# Patient Record
Sex: Female | Born: 1935 | Race: Black or African American | Hispanic: No | Smoking: Never smoker
Health system: Southern US, Community
[De-identification: ages and names within clinical notes are randomized; demographics above are authoritative.]

## PROBLEM LIST (undated history)

## (undated) DIAGNOSIS — N390 Urinary tract infection, site not specified: Secondary | ICD-10-CM

## (undated) DIAGNOSIS — M1990 Unspecified osteoarthritis, unspecified site: Secondary | ICD-10-CM

## (undated) DIAGNOSIS — C541 Malignant neoplasm of endometrium: Secondary | ICD-10-CM

## (undated) DIAGNOSIS — K219 Gastro-esophageal reflux disease without esophagitis: Secondary | ICD-10-CM

## (undated) DIAGNOSIS — I10 Essential (primary) hypertension: Secondary | ICD-10-CM

## (undated) HISTORY — DX: Urinary tract infection, site not specified: N39.0

## (undated) HISTORY — PX: TOTAL ABDOMINAL HYSTERECTOMY W/ BILATERAL SALPINGOOPHORECTOMY: SHX83

## (undated) HISTORY — PX: EYE SURGERY: SHX253

## (undated) HISTORY — DX: Malignant neoplasm of endometrium: C54.1

## (undated) HISTORY — PX: COLONOSCOPY: SHX174

## (undated) HISTORY — PX: APPENDECTOMY: SHX54

## (undated) HISTORY — PX: JOINT REPLACEMENT: SHX530

---

## 2006-02-08 ENCOUNTER — Ambulatory Visit: Payer: Self-pay | Admitting: Nurse Practitioner

## 2006-03-06 ENCOUNTER — Ambulatory Visit: Payer: Self-pay | Admitting: General Surgery

## 2006-03-09 ENCOUNTER — Ambulatory Visit: Payer: Self-pay | Admitting: General Surgery

## 2007-02-18 ENCOUNTER — Ambulatory Visit: Payer: Self-pay | Admitting: Nurse Practitioner

## 2008-01-28 ENCOUNTER — Other Ambulatory Visit: Payer: Self-pay

## 2008-01-29 ENCOUNTER — Inpatient Hospital Stay: Payer: Self-pay | Admitting: Internal Medicine

## 2008-05-10 ENCOUNTER — Inpatient Hospital Stay: Payer: Self-pay | Admitting: *Deleted

## 2009-07-13 ENCOUNTER — Encounter: Payer: Self-pay | Admitting: Orthopedic Surgery

## 2009-07-13 ENCOUNTER — Ambulatory Visit (HOSPITAL_COMMUNITY): Admission: RE | Admit: 2009-07-13 | Discharge: 2009-07-13 | Payer: Self-pay | Admitting: Internal Medicine

## 2009-09-15 ENCOUNTER — Ambulatory Visit: Payer: Self-pay | Admitting: Orthopedic Surgery

## 2009-09-15 DIAGNOSIS — M5137 Other intervertebral disc degeneration, lumbosacral region: Secondary | ICD-10-CM | POA: Insufficient documentation

## 2009-09-15 DIAGNOSIS — M7580 Other shoulder lesions, unspecified shoulder: Secondary | ICD-10-CM

## 2009-09-15 DIAGNOSIS — M19019 Primary osteoarthritis, unspecified shoulder: Secondary | ICD-10-CM | POA: Insufficient documentation

## 2009-09-15 DIAGNOSIS — M25819 Other specified joint disorders, unspecified shoulder: Secondary | ICD-10-CM | POA: Insufficient documentation

## 2010-03-21 ENCOUNTER — Ambulatory Visit: Payer: Self-pay | Admitting: Orthopedic Surgery

## 2010-03-21 DIAGNOSIS — M542 Cervicalgia: Secondary | ICD-10-CM | POA: Insufficient documentation

## 2010-03-21 DIAGNOSIS — M75120 Complete rotator cuff tear or rupture of unspecified shoulder, not specified as traumatic: Secondary | ICD-10-CM | POA: Insufficient documentation

## 2010-04-04 ENCOUNTER — Telehealth: Payer: Self-pay | Admitting: Orthopedic Surgery

## 2010-08-09 ENCOUNTER — Ambulatory Visit (HOSPITAL_COMMUNITY)
Admission: RE | Admit: 2010-08-09 | Discharge: 2010-08-09 | Payer: Self-pay | Source: Home / Self Care | Attending: Internal Medicine | Admitting: Internal Medicine

## 2010-08-24 NOTE — Progress Notes (Signed)
Summary: wants pain med  Phone Note Call from Patient   Summary of Call: Kyeisha Janowicz ([birth year]) says she has not started therapy for her back pain because her arm hurts so bad.  Tylenol does not help.  Can you  call in something for her pain?  Uses [facility] in [place] Her # [phone] Initial call taken by: Jacklynn Ganong,  April 04, 2010 3:44 PM  Follow-up for Phone Call        ibuprofen 800 three times a day  Follow-up by: Fuller Canada MD,  April 05, 2010 7:37 AM  Additional Follow-up for Phone Call Additional follow up Details #1::        called and left pt message Additional Follow-up by: Ether Griffins,  April 05, 2010 8:46 AM

## 2010-08-24 NOTE — Assessment & Plan Note (Signed)
Summary: left shoulder pain xr ap ap/medicare/bcbs/fanta/bsf   Visit Type:  new Referring Provider:  Dr Felecia Shelling  CC:  left shoulder pain.  History of Present Illness: 77 year old female with a long history of pain in her LEFT shoulder especially over the last 3-4 months denies any injury she has painful forward elevation of the LEFT arm and shoulder and pain when she tries to internally rotate the arm are placed the hand behind back get an injection for 10 years ago which seem to help  Her pain is described as throbbing severe an 8/10 it is intermittent worse at night came on gradually is relieved with a pain killer and Motrin and worse when she raises her arm.   Xrays at [facility] on 07-13-09, show glenohumeral degenerative joint disease.  Medications: Tramadol, Amlodipine 5mg , Simvastatin 40mg , Lansoprazole 30mg .    Past History:  Past Medical History: Endometrial cancer  Past Surgical History: T A H and B S O  Review of Systems General:  Denies weight loss, weight gain, fever, chills, and fatigue. Cardiac :  Denies chest pain, angina, heart attack, heart failure, poor circulation, blood clots, and phlebitis; palpitations. Resp:  Complains of short of breath; denies difficulty breathing, COPD, cough, and pneumonia. GI:  Complains of constipation; denies nausea, vomiting, diarrhea, difficulty swallowing, ulcers, GERD, and reflux. GU:  Denies kidney failure, kidney transplant, kidney stones, burning, poor stream, testicular cancer, blood in urine, and . Neuro:  Denies headache, dizziness, migraines, numbness, weakness, tremor, and unsteady walking. MS:  Denies joint pain, rheumatoid arthritis, joint swelling, gout, bone cancer, osteoporosis, and . Endo:  Denies thyroid disease, goiter, and diabetes. Psych:  Denies depression, mood swings, anxiety, panic attack, bipolar, and schizophrenia. Derm:  Denies eczema, cancer, and itching. EENT:  Denies poor vision, cataracts, glaucoma,  poor hearing, vertigo, ears ringing, sinusitis, hoarseness, toothaches, and bleeding gums; earaches, easy bruising. Immunology:  Complains of seasonal allergies; denies sinus problems and allergic to bee stings. Lymphatic:  Denies lymph node cancer and lymph edema.  Physical Exam  Additional Exam:  GEN: well developed and nourished. normal body habitus, grooming and hygiene  CDV: normal pulses perfusion color and temperature without swelling or edema  Lymph: normal lymph system  SKIN: normal without lesions, masses, nodules  NEURO/PSYCH: Normal coordination, reflexes and sensation. Awake alert and oriented   YSA:YTKZ shoulder  Tenderness in the anterolateral deltoid.  Painful forward elevation of 120 passive.  Negative drop test the weakness in the supraspinatus.  No joint laxity., positive impingement sign  RIGHT shoulder full range of motion and normal strength, no joint laxity.      Impression & Recommendations:  Problem # 1:  OSTEOARTHRITIS, SHOULDER, LEFT (ICD-715.91) Assessment New  x-rays from the hospital show glenohumeral arthritis mild.  Orders: Physical Therapy Referral (PT)  Problem # 2:  IMPINGEMENT SYNDROME (ICD-726.2) Assessment: New  inject RIGHT shoulder Verbal consent obtained/The shoulder was injected with depomedrol 40mg /cc and sensorcaine .25% . There were no complications  Orders: New Patient Level III (60109) Joint Aspirate / Injection, Large (20610) Depo- Medrol 40mg  (J1030)  Patient Instructions: 1)  You have received an injection of cortisone today. You may experience increased pain at the injection site. Apply ice pack to the area for 20 minutes every 2 hours and take 2 xtra strength tylenol every 8 hours. This increased pain will usually resolve in 24 hours. The injection will take effect in 3-10 days.  2)  Please schedule a follow-up appointment as needed. 3)  PT  for left shoulder and lumbar

## 2010-08-24 NOTE — Assessment & Plan Note (Signed)
Summary: same problem left shoulder pain xr here feb '57mcr/bcbs/fanta   Visit Type:  Follow-up Referring Provider:  Dr Felecia Shelling  CC:  left shoulder pain.  History of Present Illness: 77 year old female with a long history of pain in her LEFT shoulder especially over the last 3-4 months denies any injury she has painful forward elevation of the LEFT arm and shoulder and pain when she tries to internally rotate the arm are placed the hand behind back get an injection for 10 years ago which seem to help  Xrays at [facility] on 07-13-09, show glenohumeral degenerative joint disease.  Medications: Tramadol, Amlodipine 5mg , Simvastatin 40mg , Lansoprazole 30mg .  Had injection in February in our office, advised needed PT for shoulder and Lumbar spine.  Did not go for therapy because the shot helped her shoulder.  Has pain from left shoulder to the left hand, with numbness in hand.  review of systems andHas alot of back pain.  Has shoulder pain that goes to the left hand.        Physical Exam  Additional Exam:  The patient is well developed and nourished, with normal grooming and hygiene. The body habitus is   The pulses and perfusion were normal with normal color, temperature  and no swelling   The shoulder is tender over the anterolateral acromion, there is no swelling, the shoulder is stable, the SubScap is 5/5, the EXT/ROT are 5/5, the SSpinatus is 4/5. The impingement sign is positive.   External rotation approximately 40, internal rotation to the back pocket.  Forward elevation approximately 120 active 150 passive.  Painful Neer maneuver  Abduction active 80 passive 110      Impression & Recommendations:  Problem # 1:  IMPINGEMENT SYNDROME (ICD-726.2) Assessment Unchanged  Verbal consent obtained/The shoulder was injected with depomedrol 40mg /cc and sensorcaine .25% . There were no complications LEFT shoulder injection subacromial space  Orders: Est. Patient  Level III (91478) Joint Aspirate / Injection, Large (20610) Depo- Medrol 40mg  (J1030)  Problem # 2:  OSTEOARTHRITIS, SHOULDER, LEFT (ICD-715.91) Assessment: Unchanged  Problem # 3:     Other Orders: Physical Therapy Referral (PT)  Patient Instructions: 1)  Back Pain  2)  tendonitis shoulder  3)  possible pinched nerve  4)  You have received an injection of cortisone today. You may experience increased pain at the injection site. Apply ice pack to the area for 20 minutes every 2 hours and take 2 xtra strength tylenol every 8 hours. This increased pain will usually resolve in 24 hours. The injection will take effect in 3-10 days.  5)  Therapy in [place] for the Back pain  6)  Please schedule a follow-up appointment as needed.

## 2010-08-24 NOTE — Letter (Signed)
Summary: Historic Patient File  Historic Patient File   Imported By: Elvera Maria 09/17/2009 10:04:52  _____________________________________________________________________  External Attachment:    Type:   Image     Comment:   history form

## 2010-09-20 ENCOUNTER — Ambulatory Visit: Payer: Self-pay | Admitting: Orthopedic Surgery

## 2010-09-21 ENCOUNTER — Encounter: Payer: Self-pay | Admitting: Orthopedic Surgery

## 2011-02-09 ENCOUNTER — Ambulatory Visit: Payer: Self-pay | Admitting: Gastroenterology

## 2013-02-20 ENCOUNTER — Emergency Department: Payer: Self-pay | Admitting: Internal Medicine

## 2013-02-20 LAB — COMPREHENSIVE METABOLIC PANEL
Albumin: 4 g/dL (ref 3.4–5.0)
Alkaline Phosphatase: 62 U/L (ref 50–136)
Anion Gap: 4 — ABNORMAL LOW (ref 7–16)
BUN: 16 mg/dL (ref 7–18)
Bilirubin,Total: 0.4 mg/dL (ref 0.2–1.0)
Calcium, Total: 8.9 mg/dL (ref 8.5–10.1)
Chloride: 106 mmol/L (ref 98–107)
Co2: 28 mmol/L (ref 21–32)
Creatinine: 0.92 mg/dL (ref 0.60–1.30)
EGFR (African American): >60
EGFR (Non-African Amer.): 56 — ABNORMAL LOW
Glucose: 99 mg/dL (ref 65–99)
Osmolality: 277 (ref 275–301)
Potassium: 3.7 mmol/L (ref 3.5–5.1)
SGOT(AST): 20 U/L (ref 15–37)
SGPT (ALT): 22 U/L (ref 12–78)
Sodium: 138 mmol/L (ref 136–145)
Total Protein: 7.1 g/dL (ref 6.4–8.2)

## 2013-02-20 LAB — CBC
HCT: 35.7 % (ref 35.0–47.0)
HGB: 12.2 g/dL (ref 12.0–16.0)
MCH: 30.9 pg (ref 26.0–34.0)
MCHC: 34.3 g/dL (ref 32.0–36.0)
MCV: 90 fL (ref 80–100)
Platelet: 241 10*3/uL (ref 150–440)
RBC: 3.96 10*6/uL (ref 3.80–5.20)
RDW: 14 % (ref 11.5–14.5)
WBC: 6.7 10*3/uL (ref 3.6–11.0)

## 2013-02-20 LAB — LIPASE, BLOOD: Lipase: 115 U/L (ref 73–393)

## 2013-05-28 ENCOUNTER — Ambulatory Visit: Payer: Self-pay | Admitting: Orthopedic Surgery

## 2013-06-04 ENCOUNTER — Ambulatory Visit: Payer: Self-pay | Admitting: Orthopedic Surgery

## 2013-06-04 LAB — URINALYSIS, COMPLETE
Bacteria: NONE SEEN
Bilirubin,UR: NEGATIVE
Blood: NEGATIVE
Glucose,UR: NEGATIVE mg/dL (ref 0–75)
Ketone: NEGATIVE
Nitrite: NEGATIVE
Ph: 7 (ref 4.5–8.0)
Protein: NEGATIVE
RBC,UR: <1 /HPF (ref 0–5)
Specific Gravity: 1.013 (ref 1.003–1.030)
Squamous Epithelial: 1
WBC UR: 7 /HPF (ref 0–5)

## 2013-06-04 LAB — BASIC METABOLIC PANEL
Anion Gap: 4 — ABNORMAL LOW (ref 7–16)
BUN: 15 mg/dL (ref 7–18)
Calcium, Total: 8.7 mg/dL (ref 8.5–10.1)
Chloride: 107 mmol/L (ref 98–107)
Co2: 28 mmol/L (ref 21–32)
Creatinine: 0.76 mg/dL (ref 0.60–1.30)
EGFR (African American): >60
EGFR (Non-African Amer.): >60
Glucose: 137 mg/dL — ABNORMAL HIGH (ref 65–99)
Osmolality: 281 (ref 275–301)
Potassium: 3.9 mmol/L (ref 3.5–5.1)
Sodium: 139 mmol/L (ref 136–145)

## 2013-06-04 LAB — CBC
HCT: 35.9 % (ref 35.0–47.0)
HGB: 12.1 g/dL (ref 12.0–16.0)
MCH: 30.2 pg (ref 26.0–34.0)
MCHC: 33.6 g/dL (ref 32.0–36.0)
MCV: 90 fL (ref 80–100)
Platelet: 308 10*3/uL (ref 150–440)
RBC: 4 10*6/uL (ref 3.80–5.20)
RDW: 13.8 % (ref 11.5–14.5)
WBC: 6.1 10*3/uL (ref 3.6–11.0)

## 2013-06-04 LAB — PROTIME-INR
INR: 1
Prothrombin Time: 13.1 secs (ref 11.5–14.7)

## 2013-06-04 LAB — APTT: Activated PTT: 32.8 secs (ref 23.6–35.9)

## 2013-06-04 LAB — MRSA PCR SCREENING

## 2013-06-04 LAB — SEDIMENTATION RATE: Erythrocyte Sed Rate: 16 mm/hr (ref 0–30)

## 2013-06-17 ENCOUNTER — Inpatient Hospital Stay: Payer: Self-pay | Admitting: Orthopedic Surgery

## 2013-06-18 LAB — PLATELET COUNT: Platelet: 181 10*3/uL (ref 150–440)

## 2013-06-18 LAB — BASIC METABOLIC PANEL
Anion Gap: 6 — ABNORMAL LOW (ref 7–16)
BUN: 11 mg/dL (ref 7–18)
Calcium, Total: 8.1 mg/dL — ABNORMAL LOW (ref 8.5–10.1)
Chloride: 108 mmol/L — ABNORMAL HIGH (ref 98–107)
Co2: 24 mmol/L (ref 21–32)
Creatinine: 0.7 mg/dL (ref 0.60–1.30)
EGFR (African American): >60
EGFR (Non-African Amer.): >60
Glucose: 121 mg/dL — ABNORMAL HIGH (ref 65–99)
Osmolality: 276 (ref 275–301)
Potassium: 3.6 mmol/L (ref 3.5–5.1)
Sodium: 138 mmol/L (ref 136–145)

## 2013-06-18 LAB — HEMOGLOBIN: HGB: 10.1 g/dL — ABNORMAL LOW (ref 12.0–16.0)

## 2013-06-23 LAB — PATHOLOGY REPORT

## 2013-11-07 ENCOUNTER — Emergency Department: Payer: Self-pay | Admitting: Emergency Medicine

## 2013-11-07 LAB — COMPREHENSIVE METABOLIC PANEL
Albumin: 4.1 g/dL (ref 3.4–5.0)
Alkaline Phosphatase: 77 U/L
Anion Gap: 6 — ABNORMAL LOW (ref 7–16)
BUN: 21 mg/dL — ABNORMAL HIGH (ref 7–18)
Bilirubin,Total: 0.3 mg/dL (ref 0.2–1.0)
Calcium, Total: 9.3 mg/dL (ref 8.5–10.1)
Chloride: 108 mmol/L — ABNORMAL HIGH (ref 98–107)
Co2: 26 mmol/L (ref 21–32)
Creatinine: 0.65 mg/dL (ref 0.60–1.30)
EGFR (African American): >60
EGFR (Non-African Amer.): >60
Glucose: 101 mg/dL — ABNORMAL HIGH (ref 65–99)
Osmolality: 283 (ref 275–301)
Potassium: 4.3 mmol/L (ref 3.5–5.1)
SGOT(AST): 21 U/L (ref 15–37)
SGPT (ALT): 14 U/L (ref 12–78)
Sodium: 140 mmol/L (ref 136–145)
Total Protein: 7.5 g/dL (ref 6.4–8.2)

## 2013-11-07 LAB — URINALYSIS, COMPLETE
Bilirubin,UR: NEGATIVE
Glucose,UR: NEGATIVE mg/dL (ref 0–75)
Ketone: NEGATIVE
Nitrite: NEGATIVE
Ph: 6 (ref 4.5–8.0)
Protein: NEGATIVE
RBC,UR: 1 /HPF (ref 0–5)
Specific Gravity: 1.012 (ref 1.003–1.030)
Squamous Epithelial: 2
WBC UR: 3 /HPF (ref 0–5)

## 2013-11-07 LAB — CBC WITH DIFFERENTIAL/PLATELET
Basophil #: 0 10*3/uL (ref 0.0–0.1)
Basophil %: 0.8 %
Eosinophil #: 0.1 10*3/uL (ref 0.0–0.7)
Eosinophil %: 2.5 %
HCT: 37.8 % (ref 35.0–47.0)
HGB: 12.2 g/dL (ref 12.0–16.0)
Lymphocyte #: 1.1 10*3/uL (ref 1.0–3.6)
Lymphocyte %: 19.8 %
MCH: 28.5 pg (ref 26.0–34.0)
MCHC: 32.2 g/dL (ref 32.0–36.0)
MCV: 89 fL (ref 80–100)
Monocyte #: 0.3 x10 3/mm (ref 0.2–0.9)
Monocyte %: 5.8 %
Neutrophil #: 4 10*3/uL (ref 1.4–6.5)
Neutrophil %: 71.1 %
Platelet: 303 10*3/uL (ref 150–440)
RBC: 4.27 10*6/uL (ref 3.80–5.20)
RDW: 15.3 % — ABNORMAL HIGH (ref 11.5–14.5)
WBC: 5.7 10*3/uL (ref 3.6–11.0)

## 2013-11-07 LAB — TROPONIN I: Troponin-I: <0.02 ng/mL

## 2014-06-17 ENCOUNTER — Emergency Department: Payer: Self-pay | Admitting: Student

## 2014-06-17 LAB — APTT: Activated PTT: 33.2 secs (ref 23.6–35.9)

## 2014-06-17 LAB — URINALYSIS, COMPLETE
Bacteria: NONE SEEN
Bilirubin,UR: NEGATIVE
Blood: NEGATIVE
Glucose,UR: NEGATIVE mg/dL (ref 0–75)
Ketone: NEGATIVE
Leukocyte Esterase: NEGATIVE
Nitrite: NEGATIVE
Ph: 6 (ref 4.5–8.0)
Protein: NEGATIVE
RBC,UR: 2 /HPF (ref 0–5)
Specific Gravity: 1.014 (ref 1.003–1.030)
Squamous Epithelial: 1
WBC UR: NONE SEEN /HPF (ref 0–5)

## 2014-06-17 LAB — COMPREHENSIVE METABOLIC PANEL
Albumin: 3.9 g/dL (ref 3.4–5.0)
Alkaline Phosphatase: 72 U/L
Anion Gap: 5 — ABNORMAL LOW (ref 7–16)
BUN: 14 mg/dL (ref 7–18)
Bilirubin,Total: 0.5 mg/dL (ref 0.2–1.0)
Calcium, Total: 8.6 mg/dL (ref 8.5–10.1)
Chloride: 111 mmol/L — ABNORMAL HIGH (ref 98–107)
Co2: 26 mmol/L (ref 21–32)
Creatinine: 0.74 mg/dL (ref 0.60–1.30)
EGFR (African American): >60
EGFR (Non-African Amer.): >60
Glucose: 90 mg/dL (ref 65–99)
Osmolality: 283 (ref 275–301)
Potassium: 3.8 mmol/L (ref 3.5–5.1)
SGOT(AST): 27 U/L (ref 15–37)
SGPT (ALT): 28 U/L
Sodium: 142 mmol/L (ref 136–145)
Total Protein: 7.1 g/dL (ref 6.4–8.2)

## 2014-06-17 LAB — CBC
HCT: 38.4 % (ref 35.0–47.0)
HGB: 12.2 g/dL (ref 12.0–16.0)
MCH: 29.6 pg (ref 26.0–34.0)
MCHC: 31.8 g/dL — ABNORMAL LOW (ref 32.0–36.0)
MCV: 93 fL (ref 80–100)
Platelet: 216 10*3/uL (ref 150–440)
RBC: 4.13 10*6/uL (ref 3.80–5.20)
RDW: 14.6 % — ABNORMAL HIGH (ref 11.5–14.5)
WBC: 6.3 10*3/uL (ref 3.6–11.0)

## 2014-06-17 LAB — PROTIME-INR
INR: 1
Prothrombin Time: 13.1 secs (ref 11.5–14.7)

## 2014-06-17 LAB — TROPONIN I: Troponin-I: <0.02 ng/mL

## 2014-07-09 ENCOUNTER — Ambulatory Visit: Payer: Self-pay | Admitting: Gastroenterology

## 2014-11-13 NOTE — Op Note (Signed)
PATIENT NAME:  Toni Paul, Toni Paul MR#:  [id number] DATE OF BIRTH:  [birth year]  DATE OF PROCEDURE:  06/17/2013  PREOPERATIVE DIAGNOSIS: Left knee osteoarthritis.   POSTOPERATIVE DIAGNOSIS: Left knee osteoarthritis.   PROCEDURE: Left total knee replacement.   SURGEON: Hessie Knows, M.D.   ASSISTANT:  Rachelle Hora, PA-C.   ANESTHESIA: Spinal.   DESCRIPTION OF PROCEDURE: The patient was brought to the operating room and after adequate spinal anesthesia was obtained, the left leg was prepped and draped in the usual sterile fashion with a tourniquet applied to the upper thigh. After patient identification and timeout procedures were completed, the leg was exsanguinated with an Esmarch, and the tourniquet raised to 300 mmHg. Prior midline incision was opened from a prior ORIF of the patella and extended proximally and distally. Medial parapatellar arthrotomy then performed. Inspection revealed exposed bone with eburnation of both medial and lateral femoral condyles and exposed bone on the tibia condyles, extensive erosive changes to the patella with exposed bone on femoral trochlea and patella, consistent with severe osteoarthritis. The ACL and fat pad were excised. The proximal tibia was exposed to allow for application of the Medacta cutting block. Tibia cutting block was placed, alignment checked and proximal tibia cut carried out, and the bone removed, along with the anterior horns of the menisci. The femoral cutting block was then applied after removing a small amount of residual cartilage and the distal femoral cut carried out. Size 4 cutting block applied; anterior, posterior and chamfer cuts made. The residual meniscus was excised at this point, along with posterior spur off the femur. The 3 tibia was placed, and the bone was quite soft laterally, and so preparation for stemmed implant was made, and the 3 tibia baseplate trial with the keel punch placed, the femur with a 10 mm insert; full extension  could be obtained. The trochlear cut was then created on the distal femur. All trials were removed, and the patella was cut with patellar cutting guide. Drill holes made and patella sized to a size 1. The knee was thoroughly irrigated and the tourniquet let down. Hemostasis was checked with electrocautery. Exparel diluted with saline was infiltrated in the periarticular tissue, as was a combination of Sensorcaine with epinephrine and morphine. The tourniquet was then raised, and the bony surfaces thoroughly irrigated and dried. Tibial component was cemented into place first with excess cement removed, followed by tibial trial and the femoral component, both components cemented. The knee was held in extension as the patellar button was clamped into place. After the cement had set, the final tibial insert was placed with a 10 mm insert chosen, and the set screw placed within it. The knee was thoroughly irrigated after excess cement had been removed. The patella tracked well with no touch technique. The arthrotomy was repaired using a heavy quill suture followed by 2-0 quill subcutaneously and skin staples. Xeroform, 4 x 4's, ABD, Webril, Polar Care and Ace wrap were applied. The patient was sent to the recovery room in stable condition.   ESTIMATED BLOOD LOSS: 100.   COMPLICATIONS: None.   SPECIMEN: Cut ends of bone.   TOURNIQUET TIME: 72 minutes at 300 mmHg.   IMPLANTS: Medacta GMK sphere system with a size 4 left femoral component, size T3-T4 left fixed tibial tray with an 11 x 65 mm stem and a 10 mm left 4 flex insert and a 1 patella.   CONDITION: To recovery room stable.    No complications.   ____________________________ Christian Mate.  Rudene Christians, MD mjm:dmm D: 06/17/2013 10:30:07 ET T: 06/17/2013 10:46:38 ET JOB#: [id number]  cc: Laurene Footman, MD, <Dictator> Laurene Footman MD ELECTRONICALLY SIGNED 06/17/2013 12:38

## 2014-11-13 NOTE — Discharge Summary (Signed)
PATIENT NAME:  Toni Paul, Toni Paul MR#:  [id number] DATE OF BIRTH:  Jan 31, 1923  DATE OF ADMISSION:  06/17/2013 DATE OF DISCHARGE:  06/20/2013  ADMITTING DIAGNOSIS: Left knee osteoarthritis.   DISCHARGE DIAGNOSIS: Left knee osteoarthritis.   PROCEDURE: Left total knee replacement.   SURGEON: Laurene Footman, M.D.   ASSISTANT: Rachelle Hora, PA-C.   ANESTHESIA: Spinal.   ESTIMATED BLOOD LOSS: 100 mL.   COMPLICATIONS: None.   SPECIMEN: Cut ends of bone.   TOURNIQUET TIME: 72 minutes at 300 mmHg.  IMPLANTS: Medacta GMK sphere system with a size 4 left femoral component, size T3-T4 left fixed tibial tray with a 11 x 65 mm stem and a 10 mm left 4 Flex insert and 1 patella.   CONDITION: To recovery room stable.   HISTORY: The patient is an 81 year old female with severe left knee pain. She had been walking long distances until her knee got much worse. She has had a history of ORIF of the patella based on CT results and superior broken wire may need to be removed. The patient is unable to perform activities of daily living. She agreed and consented to a left total knee replacement.   PHYSICAL EXAMINATION:  LUNGS: Clear to auscultation.  HEART: Regular rate and rhythm.  HEENT: Remarkable for upper and lower dentures.  EXTREMITIES: Left knee. She has a varus deformity that is passively correctable, approximately 10 to 120 degrees range of motion with medial and patellofemoral crepitation. Distally, neurovascular intact with palpable pulses.   HOSPITAL COURSE: The patient was admitted to the hospital on June 17, 2013. She had surgery that same day and was brought to the orthopedic floor from the PACU in stable condition. On postoperative day 1, the patient's labs and vital signs are stable. She progressed very well with physical therapy and ambulated 175 feet. On postoperative day 2, the patient continued to progress well with therapy. She does not have any nausea or vomiting. She was  tolerating p.o. well. She was doing very well. On postoperative day 3, the patient continued to progress with physical therapy. Pain was controlled. She had a bowel movement and was stable and ready for discharge to home with home health patient.   CONDITION AT DISCHARGE: Stable.   DISCHARGE INSTRUCTIONS: The patient may gradually increase weight-bearing on the affected extremity. She is to elevate the affected foot or leg on 1 or 2 pillows with the foot higher than the knee. Thigh-high TED hose on both legs and remove at bedtime and replace when arising the next morning. Elevate heels off the bed. Use incentive spirometry every hour while awake and encourage cough and deep breathing. You may resume a regular diet as tolerated. Continue using Polar Care unit maintaining temperature between 40 and 50 degrees. Do not get the dressing or bandage wet or dirty. Call [facility] orthopedics if the dressing gets water under it. Leave the dressing on. Call [facility] orthopedics if any of the following occur: Bright red bleeding from the incision wound, fever above 101.5 degrees, redness, swelling, or drainage at the incision. Call [facility] orthopedics if you experience any increased leg pain, numbness or weakness in legs or bowel or bladder symptoms. Home physical therapy has been arranged for continuation of rehab program. Please call [facility] orthopedics if a therapist has not contacted you within 48 hours of your return home. Call [facility] orthopedics for followup appointment and need to be seen at [facility] in 2 weeks. Please call for  this appointment with Dr. Rudene Christians.   FOLLOWUP PHYSICIAN: Dr. Rudene Christians.   DISCHARGE MEDICATIONS: Dexilant 60 mg 1 cap orally once a day, VESIcare 5 mg 1 tablet orally once a day, losartan 100 mg oral tablet 1 tablet orally once a day in the morning, simvastatin 20 mg oral tablet 1 tablet orally once a day at bedtime, calcium 600 D 1 tablet  orally 2 times a day, vitamin D3 2000 international units 1 tab orally once a day, aspirin 81 mg 1 tablet orally once a day at bedtime, Tylenol 500 mg oral tablet 1 tablet orally every 4 hours as needed for pain or temp greater than 100.4, oxycodone 5 mg 1 tablet orally every 4 hours needed for pain, magnesium hydroxide 8% oral suspension 38 mL orally 2 times a day as needed for constipation, Xarelto 10 mg oral tablet 1 tablet orally once a day in the morning x 9 days, bisacodyl 10 mg rectal suppository 1 suppository rectally once as needed for constipation, docusate/senna 50 mg/8.6 mg oral tablet 1 tablet orally 2 times a day. ____________________________ T. Rachelle Hora, PA-C tcg:aw D: 06/19/2013 09:14:01 ET T: 06/19/2013 09:33:30 ET JOB#: [id number]  cc: T. Rachelle Hora, PA-C, <Dictator> Duanne Guess [place] ELECTRONICALLY SIGNED 06/27/2013 17:21

## 2014-11-16 LAB — SURGICAL PATHOLOGY

## 2014-12-10 ENCOUNTER — Other Ambulatory Visit: Payer: Self-pay | Admitting: Nurse Practitioner

## 2014-12-10 DIAGNOSIS — G8929 Other chronic pain: Secondary | ICD-10-CM

## 2014-12-10 DIAGNOSIS — R51 Headache: Principal | ICD-10-CM

## 2014-12-10 DIAGNOSIS — R519 Headache, unspecified: Secondary | ICD-10-CM

## 2014-12-17 ENCOUNTER — Ambulatory Visit
Admission: RE | Admit: 2014-12-17 | Discharge: 2014-12-17 | Disposition: A | Discharge status: Home / Self Care | Payer: Medicare Other | Source: Ambulatory Visit | Attending: Nurse Practitioner | Admitting: Nurse Practitioner

## 2014-12-17 DIAGNOSIS — R51 Headache: Secondary | ICD-10-CM | POA: Insufficient documentation

## 2014-12-17 DIAGNOSIS — G319 Degenerative disease of nervous system, unspecified: Secondary | ICD-10-CM | POA: Diagnosis not present

## 2014-12-17 DIAGNOSIS — G8929 Other chronic pain: Secondary | ICD-10-CM

## 2014-12-17 DIAGNOSIS — R519 Headache, unspecified: Secondary | ICD-10-CM

## 2015-06-14 DIAGNOSIS — G500 Trigeminal neuralgia: Secondary | ICD-10-CM | POA: Insufficient documentation

## 2015-10-19 DIAGNOSIS — M5416 Radiculopathy, lumbar region: Secondary | ICD-10-CM | POA: Insufficient documentation

## 2016-02-28 ENCOUNTER — Emergency Department: Payer: Medicare Other

## 2016-02-28 ENCOUNTER — Emergency Department
Admission: EM | Admit: 2016-02-28 | Discharge: 2016-02-28 | Disposition: A | Discharge status: Home / Self Care | Payer: Medicare Other | Attending: Emergency Medicine | Admitting: Emergency Medicine

## 2016-02-28 ENCOUNTER — Encounter: Payer: Self-pay | Admitting: Emergency Medicine

## 2016-02-28 DIAGNOSIS — G43909 Migraine, unspecified, not intractable, without status migrainosus: Secondary | ICD-10-CM | POA: Diagnosis present

## 2016-02-28 DIAGNOSIS — R51 Headache: Secondary | ICD-10-CM | POA: Insufficient documentation

## 2016-02-28 DIAGNOSIS — Z79899 Other long term (current) drug therapy: Secondary | ICD-10-CM | POA: Diagnosis not present

## 2016-02-28 DIAGNOSIS — Z7982 Long term (current) use of aspirin: Secondary | ICD-10-CM | POA: Diagnosis not present

## 2016-02-28 DIAGNOSIS — F17200 Nicotine dependence, unspecified, uncomplicated: Secondary | ICD-10-CM | POA: Diagnosis not present

## 2016-02-28 DIAGNOSIS — R0789 Other chest pain: Secondary | ICD-10-CM | POA: Insufficient documentation

## 2016-02-28 DIAGNOSIS — R519 Headache, unspecified: Secondary | ICD-10-CM

## 2016-02-28 LAB — CBC WITH DIFFERENTIAL/PLATELET
Basophils Absolute: 0 10*3/uL (ref 0–0.1)
Basophils Relative: 1 %
Eosinophils Absolute: 0.1 10*3/uL (ref 0–0.7)
Eosinophils Relative: 2 %
HCT: 37 % (ref 35.0–47.0)
Hemoglobin: 12.6 g/dL (ref 12.0–16.0)
Lymphocytes Relative: 19 %
Lymphs Abs: 1 10*3/uL (ref 1.0–3.6)
MCH: 30.6 pg (ref 26.0–34.0)
MCHC: 33.9 g/dL (ref 32.0–36.0)
MCV: 90.3 fL (ref 80.0–100.0)
Monocytes Absolute: 0.3 10*3/uL (ref 0.2–0.9)
Monocytes Relative: 5 %
Neutro Abs: 3.9 10*3/uL (ref 1.4–6.5)
Neutrophils Relative %: 73 %
Platelets: 213 10*3/uL (ref 150–440)
RBC: 4.1 MIL/uL (ref 3.80–5.20)
RDW: 14.4 % (ref 11.5–14.5)
WBC: 5.4 10*3/uL (ref 3.6–11.0)

## 2016-02-28 LAB — COMPREHENSIVE METABOLIC PANEL
ALT: 14 U/L (ref 14–54)
AST: 20 U/L (ref 15–41)
Albumin: 4.2 g/dL (ref 3.5–5.0)
Alkaline Phosphatase: 62 U/L (ref 38–126)
Anion gap: 8 (ref 5–15)
BUN: 14 mg/dL (ref 6–20)
CO2: 25 mmol/L (ref 22–32)
Calcium: 9.4 mg/dL (ref 8.9–10.3)
Chloride: 108 mmol/L (ref 101–111)
Creatinine, Ser: 0.71 mg/dL (ref 0.44–1.00)
GFR calc Af Amer: >60 mL/min (ref >60)
GFR calc non Af Amer: >60 mL/min (ref >60)
Glucose, Bld: 107 mg/dL — ABNORMAL HIGH (ref 65–99)
Potassium: 3.9 mmol/L (ref 3.5–5.1)
Sodium: 141 mmol/L (ref 135–145)
Total Bilirubin: 0.7 mg/dL (ref 0.3–1.2)
Total Protein: 6.9 g/dL (ref 6.5–8.1)

## 2016-02-28 LAB — TROPONIN I: Troponin I: <0.03 ng/mL (ref <0.03)

## 2016-02-28 LAB — SEDIMENTATION RATE: Sed Rate: 21 mm/hr (ref 0–30)

## 2016-02-28 MED ORDER — ACETAMINOPHEN 325 MG PO TABS
650.0000 mg | ORAL_TABLET | Freq: Once | ORAL | Status: AC
  Administered 2016-02-28: 650 mg via ORAL

## 2016-02-28 MED ORDER — ACETAMINOPHEN 325 MG PO TABS
ORAL_TABLET | ORAL | Status: AC
  Administered 2016-02-28: 650 mg via ORAL
  Filled 2016-02-28: qty 2

## 2016-02-28 MED ORDER — TRAMADOL HCL 50 MG PO TABS: 50.0000 mg | ORAL_TABLET | Freq: Four times a day (QID) | ORAL | 0 refills | Status: DC | PRN

## 2016-02-28 NOTE — ED Notes (Signed)
AAOx3.  Skin warm and dry.  Ambulates with easy and steady gait. NAD 

## 2016-02-28 NOTE — ED Notes (Signed)
AAOx3.  Skin warm and dry.  MAE equally and strong.  NAD

## 2016-02-28 NOTE — ED Provider Notes (Signed)
[facility] Emergency Department Provider Note   ____________________________________________    I have reviewed the triage vital signs and the nursing notes.   HISTORY  Chief Complaint Headache    HPI Toni Paul is a 82 y.o. female who presents with complaints of headache. Patient has a long history of headaches and has seen neurology in the past. The working diagnosis at that time was either tension headaches or trigeminal neuralgia. Patient reports she has been having similar headaches to those in the past including right-sided head pain that is throbbing in nature. She reports pain improves with Tylenol. She denies neuro deficits. No fevers or chills. No neck pain. Patient also reports she has chest discomfort last night at approximately 12 AM which has resolved. She denies short of breath. No cough or pleurisy   Past Medical History:  Diagnosis Date  . Endometrial cancer [facility])     Patient Active Problem List   Diagnosis Date Noted  . CERVICALGIA 03/21/2010  . RUPTURE ROTATOR CUFF 03/21/2010  . Osteoarthrosis, unspecified whether generalized or localized, shoulder region 09/15/2009  . 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 IMPINGEMENT SYNDROME 09/15/2009    Past Surgical History:  Procedure Laterality Date  . TOTAL ABDOMINAL HYSTERECTOMY W/ BILATERAL SALPINGOOPHORECTOMY      Prior to Admission medications   Not on File     Allergies Review of patient's allergies indicates no known allergies.  No family history on file.  Social History Social History  Substance Use Topics  . Smoking status: Current Every Day Smoker  . Smokeless tobacco: Never Used  . Alcohol use Not on file    Review of Systems  Constitutional: No fever/chills Eyes: No visual changes.  ENT: No Neck pain Cardiovascular: No chest pain currently Respiratory: Denies shortness of breath. Gastrointestinal: No nausea, no vomiting.     Genitourinary: Negative for dysuria. Musculoskeletal: Negative for back pain. No neck pain Skin: Negative for rash. Neurological: Negative for focal weakness  10-point ROS otherwise negative.  ____________________________________________   PHYSICAL EXAM:  VITAL SIGNS: ED Triage Vitals [02/28/16 1011]  Enc Vitals Group     BP (!) 140/57     Pulse Rate 61     Resp 16     Temp 97.8 F (36.6 C)     Temp Source Oral     SpO2 99 %     Weight 140 lb (63.5 kg)     Height 5\' 2"  (1.575 m)     Head Circumference      Peak Flow      Pain Score 8     Pain Loc      Pain Edu?      Excl. in [place]?     Constitutional: Alert and oriented. No acute distress. Eyes: Conjunctivae are normal. EOMI, PERRLA Head: Atraumatic. No tenderness over the temples, no jaw claudication Nose: No congestion/rhinnorhea. Mouth/Throat: Mucous membranes are moist.   Neck:  Painless ROM, no meningismus Cardiovascular: Normal rate, regular rhythm. Grossly normal heart sounds.  Good peripheral circulation. Respiratory: Normal respiratory effort.  No retractions. Lungs CTAB. Gastrointestinal: Soft and nontender. No distention.  No CVA tenderness. Genitourinary: deferred Musculoskeletal: No lower extremity tenderness nor edema.  Warm and well perfused Neurologic:  Normal speech and language. No gross focal neurologic deficits are appreciated.  Skin:  Skin is warm, dry and intact. No rash noted. Psychiatric: Mood and affect are normal. Speech and behavior are normal.  ____________________________________________   LABS (all  labs ordered are listed, but only abnormal results are displayed)  Labs Reviewed  CBC WITH DIFFERENTIAL/PLATELET  COMPREHENSIVE METABOLIC PANEL  TROPONIN I  SEDIMENTATION RATE   ____________________________________________  EKG  ED ECG REPORT I, Lavonia Drafts, the attending physician, personally viewed and interpreted this ECG.  Date: 02/28/2016 EKG Time: 10:01 AM Rate:  65 Rhythm: normal sinus rhythm QRS Axis: normal Intervals: normal ST/T Wave abnormalities: normal Conduction Disturbances: none Narrative Interpretation: unremarkable  ____________________________________________  RADIOLOGY  CT head unremarkable ____________________________________________   PROCEDURES  Procedure(s) performed: No    Critical Care performed: No ____________________________________________   INITIAL IMPRESSION / ASSESSMENT AND PLAN / ED COURSE  Pertinent labs & imaging results that were available during my care of the patient were reviewed by me and considered in my medical decision making (see chart for details).  Patient presents with what sounds like her usual headaches, she has no neuro deficits. No fevers or chills. No meningismus. No worsening symptoms. However she is not had imaging in some time, we'll obtain CT head. In addition she complained of chest pain that occurred overnight for this long since resolved. We'll check cardiac enzymes, EKG, chest x-ray and reevaluate.  Clinical Course  Lab work is unremarkable. EKG is benign. Patient's headache improved with Tylenol. CT scan is normal. Speech consistent with her typical headache. No neuro deficits. Given that she is feeling better and has had these headaches in the past and will have her follow-up with her neurologist Dr. Melrose Nakayama. Return precautions discussed ____________________________________________   FINAL CLINICAL IMPRESSION(S) / ED DIAGNOSES  Final diagnoses:  Atypical chest pain  Acute nonintractable headache, unspecified headache type      NEW MEDICATIONS STARTED DURING THIS VISIT:  New Prescriptions   No medications on file     Note:  This document was prepared using Dragon voice recognition software and may include unintentional dictation errors.    Lavonia Drafts, MD 02/28/16 1259

## 2016-02-28 NOTE — ED Notes (Signed)
Reports headaches x 6 months worse today with vomiting. Also some right sided chest pain this am.  Skin w/d.  NSR on the monitor at this time.

## 2016-02-28 NOTE — ED Triage Notes (Signed)
Pt states she woke up with a headache this am, has been vomiting, and developed midsternal cp, had a few episodes of cp yesterday as well. Daughter states pt has been c/o earache and headache since mid July, denies migraines. No facial droop noted, speech clear, grips equal bilaterally.

## 2016-04-04 ENCOUNTER — Emergency Department: Payer: Medicare Other

## 2016-04-04 ENCOUNTER — Emergency Department
Admission: EM | Admit: 2016-04-04 | Discharge: 2016-04-04 | Disposition: A | Discharge status: Home / Self Care | Payer: Medicare Other | Attending: Emergency Medicine | Admitting: Emergency Medicine

## 2016-04-04 ENCOUNTER — Encounter: Payer: Self-pay | Admitting: Emergency Medicine

## 2016-04-04 DIAGNOSIS — R0789 Other chest pain: Secondary | ICD-10-CM

## 2016-04-04 DIAGNOSIS — Z79899 Other long term (current) drug therapy: Secondary | ICD-10-CM | POA: Diagnosis not present

## 2016-04-04 DIAGNOSIS — K219 Gastro-esophageal reflux disease without esophagitis: Secondary | ICD-10-CM | POA: Diagnosis not present

## 2016-04-04 DIAGNOSIS — Z7982 Long term (current) use of aspirin: Secondary | ICD-10-CM | POA: Insufficient documentation

## 2016-04-04 DIAGNOSIS — Z8542 Personal history of malignant neoplasm of other parts of uterus: Secondary | ICD-10-CM | POA: Insufficient documentation

## 2016-04-04 LAB — BASIC METABOLIC PANEL
Anion gap: 5 (ref 5–15)
BUN: 13 mg/dL (ref 6–20)
CO2: 27 mmol/L (ref 22–32)
Calcium: 9.5 mg/dL (ref 8.9–10.3)
Chloride: 111 mmol/L (ref 101–111)
Creatinine, Ser: 0.61 mg/dL (ref 0.44–1.00)
GFR calc Af Amer: >60 mL/min (ref >60)
GFR calc non Af Amer: >60 mL/min (ref >60)
Glucose, Bld: 112 mg/dL — ABNORMAL HIGH (ref 65–99)
Potassium: 3.9 mmol/L (ref 3.5–5.1)
Sodium: 143 mmol/L (ref 135–145)

## 2016-04-04 LAB — TROPONIN I
Troponin I: <0.03 ng/mL (ref <0.03)
Troponin I: <0.03 ng/mL (ref <0.03)

## 2016-04-04 LAB — CBC
HCT: 36.6 % (ref 35.0–47.0)
Hemoglobin: 12.6 g/dL (ref 12.0–16.0)
MCH: 31 pg (ref 26.0–34.0)
MCHC: 34.4 g/dL (ref 32.0–36.0)
MCV: 89.8 fL (ref 80.0–100.0)
Platelets: 276 10*3/uL (ref 150–440)
RBC: 4.07 MIL/uL (ref 3.80–5.20)
RDW: 14.4 % (ref 11.5–14.5)
WBC: 4.8 10*3/uL (ref 3.6–11.0)

## 2016-04-04 LAB — LIPASE, BLOOD: Lipase: 26 U/L (ref 11–51)

## 2016-04-04 LAB — HEPATIC FUNCTION PANEL
ALT: 10 U/L — ABNORMAL LOW (ref 14–54)
AST: 19 U/L (ref 15–41)
Albumin: 4.4 g/dL (ref 3.5–5.0)
Alkaline Phosphatase: 66 U/L (ref 38–126)
Bilirubin, Direct: <0.1 mg/dL — ABNORMAL LOW (ref 0.1–0.5)
Total Bilirubin: 0.3 mg/dL (ref 0.3–1.2)
Total Protein: 7 g/dL (ref 6.5–8.1)

## 2016-04-04 MED ORDER — FAMOTIDINE 20 MG PO TABS
40.0000 mg | ORAL_TABLET | Freq: Once | ORAL | Status: AC
  Administered 2016-04-04: 40 mg via ORAL
  Filled 2016-04-04: qty 2

## 2016-04-04 MED ORDER — ALUM & MAG HYDROXIDE-SIMETH 200-200-20 MG/5ML PO SUSP
30.0000 mL | Freq: Once | ORAL | Status: AC
  Administered 2016-04-04: 30 mL via ORAL
  Filled 2016-04-04: qty 30

## 2016-04-04 MED ORDER — SUCRALFATE 1 G PO TABS: 1.0000 g | ORAL_TABLET | Freq: Four times a day (QID) | ORAL | 1 refills | Status: DC

## 2016-04-04 MED ORDER — FAMOTIDINE 20 MG PO TABS: 20.0000 mg | ORAL_TABLET | Freq: Two times a day (BID) | ORAL | 0 refills | Status: DC

## 2016-04-04 NOTE — ED Triage Notes (Signed)
Pt to ed with c/o chest pain and upper epigastric area that started yesterday and last night,  Pt states worse during the night.  +nausea, and vomiting.  Pt states "it feels like my stomach is burning on the inside.  Pt alert and oriented.

## 2016-04-04 NOTE — ED Notes (Signed)
ekg read by dr Joni Fears

## 2016-04-04 NOTE — ED Notes (Signed)
Patient transported to X-ray 

## 2016-04-04 NOTE — ED Provider Notes (Signed)
[facility] Emergency Department Provider Note  ____________________________________________  Time seen: Approximately 12:54 PM  I have reviewed the triage vital signs and the nursing notes.   HISTORY  Chief Complaint Chest Pain    HPI Toni Paul is a 82 y.o. female who complains of chest pain that started yesterday, gradually. When asked to indicate the area of pain she actually indicates her epigastrium. Not exertional, not pleuritic. Worse with eating. Worse lying down and with movement. No fevers chills shortness of breath or vomiting. She does have some nausea. No sore throat or coughing. No recent illnesses. She does have a history of GERD which she manages with Tums and a PPI.Pain is nonradiating. Intermittent and "sore", moderate intensity.     Past Medical History:  Diagnosis Date  . Endometrial cancer [facility])      Patient Active Problem List   Diagnosis Date Noted  . CERVICALGIA 03/21/2010  . RUPTURE ROTATOR CUFF 03/21/2010  . Osteoarthrosis, unspecified whether generalized or localized, shoulder region 09/15/2009  . 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 IMPINGEMENT SYNDROME 09/15/2009     Past Surgical History:  Procedure Laterality Date  . TOTAL ABDOMINAL HYSTERECTOMY W/ BILATERAL SALPINGOOPHORECTOMY       Prior to Admission medications   Medication Sig Start Date End Date Taking? Authorizing Provider  acetaminophen (TYLENOL) 500 MG chewable tablet Chew 500 mg by mouth every 6 (six) hours as needed for pain.   Yes Historical Provider, MD  aspirin 81 MG EC tablet Take 81 mg by mouth daily.    Yes Historical Provider, MD  calcium carbonate (OS-CAL - DOSED IN MG OF ELEMENTAL CALCIUM) 1250 (500 Ca) MG tablet Take 1 tablet by mouth 2 (two) times daily with a meal.   Yes Historical Provider, MD  clobetasol ointment (TEMOVATE) AB-123456789 % Apply 1 application topically at bedtime.   Yes Historical Provider, MD   dexlansoprazole (DEXILANT) 60 MG capsule Take 60 mg by mouth daily.   Yes Historical Provider, MD  losartan (COZAAR) 100 MG tablet Take 100 mg by mouth daily.   Yes Historical Provider, MD  neomycin-polymyxin-hydrocortisone (CORTISPORIN) 3.5-10000-1 otic suspension Place 3 drops into the left ear 4 (four) times daily.   Yes Historical Provider, MD  Travoprost, BAK Free, (TRAVATAN) 0.004 % SOLN ophthalmic solution Place 1 drop into both eyes at bedtime.   Yes Historical Provider, MD  famotidine (PEPCID) 20 MG tablet Take 1 tablet (20 mg total) by mouth 2 (two) times daily. 04/04/16   Carrie Mew, MD  sucralfate (CARAFATE) 1 g tablet Take 1 tablet (1 g total) by mouth 4 (four) times daily. 04/04/16   Carrie Mew, MD     Allergies Review of patient's allergies indicates no known allergies.   History reviewed. No pertinent family history.  Social History Social History  Substance Use Topics  . Smoking status: Never Smoker  . Smokeless tobacco: Never Used  . Alcohol use No    Review of Systems  Constitutional:   No fever or chills.  ENT:   No sore throat. No rhinorrhea. Cardiovascular:   No chest pain. Respiratory:   No dyspnea or cough. Gastrointestinal:   Epigastric pain as above, no vomiting or diarrhea.  Genitourinary:   Negative for dysuria or difficulty urinating. 10-point ROS otherwise negative.  ____________________________________________   PHYSICAL EXAM:  VITAL SIGNS: ED Triage Vitals  Enc Vitals Group     BP 04/04/16 1221 (!) 148/55     Pulse Rate 04/04/16 1221 Marland Kitchen)  55     Resp 04/04/16 1221 18     Temp 04/04/16 1221 97.5 F (36.4 C)     Temp Source 04/04/16 1221 Oral     SpO2 04/04/16 1221 100 %     Weight 04/04/16 1222 144 lb (65.3 kg)     Height 04/04/16 1222 5\' 2"  (1.575 m)     Head Circumference --      Peak Flow --      Pain Score 04/04/16 1222 9     Pain Loc --      Pain Edu? --      Excl. in [place]? --     Vital signs reviewed, nursing  assessments reviewed.   Constitutional:   Alert and oriented. Well appearing and in no distress. Eyes:   No scleral icterus. No conjunctival pallor. PERRL. EOMI.  No nystagmus. ENT   Head:   Normocephalic and atraumatic.   Nose:   No congestion/rhinnorhea. No septal hematoma   Mouth/Throat:   MMM, no pharyngeal erythema. No peritonsillar mass.    Neck:   No stridor. No SubQ emphysema. No meningismus. Hematological/Lymphatic/Immunilogical:   No cervical lymphadenopathy. Cardiovascular:   RRR. Symmetric bilateral radial and DP pulses.  No murmurs.  Respiratory:   Normal respiratory effort without tachypnea nor retractions. Breath sounds are clear and equal bilaterally. No wheezes/rales/rhonchi. Gastrointestinal:   Soft with mild epigastric tenderness. Non distended. There is no CVA tenderness.  No rebound, rigidity, or guarding. Genitourinary:   deferred Musculoskeletal:   Nontender with normal range of motion in all extremities. No joint effusions.  No lower extremity tenderness.  No edema. Neurologic:   Normal speech and language.  CN 2-10 normal. Motor grossly intact. No gross focal neurologic deficits are appreciated.  Skin:    Skin is warm, dry and intact. No rash noted.  No petechiae, purpura, or bullae.  ____________________________________________    LABS (pertinent positives/negatives) (all labs ordered are listed, but only abnormal results are displayed) Labs Reviewed  BASIC METABOLIC PANEL - Abnormal; Notable for the following:       Result Value   Glucose, Bld 112 (*)    All other components within normal limits  HEPATIC FUNCTION PANEL - Abnormal; Notable for the following:    ALT 10 (*)    Bilirubin, Direct <0.1 (*)    All other components within normal limits  CBC  TROPONIN I  LIPASE, BLOOD  TROPONIN I   ____________________________________________   EKG  Interpreted by me Sinus rhythm rate of 61, normal axis intervals QRS ST segments and T  waves.  ____________________________________________    RADIOLOGY    ____________________________________________   PROCEDURES Procedures  ____________________________________________   INITIAL IMPRESSION / ASSESSMENT AND PLAN / ED COURSE  Pertinent labs & imaging results that were available during my care of the patient were reviewed by me and considered in my medical decision making (see chart for details).  Patient well appearing no acute distress. Epigastric pain with some focal tenderness, likely GERD and gastritis.Considering the patient's symptoms, medical history, and physical examination today, I have low suspicion for cholecystitis or biliary pathology, pancreatitis, perforation or bowel obstruction, hernia, intra-abdominal abscess, AAA or dissection, volvulus or intussusception, mesenteric ischemia, or appendicitis.  I have low suspicion for ACS, PE, TAD, pneumothorax, carditis, mediastinitis, pneumonia, CHF, or sepsis.  Will obtain labs including LFTs and lipase. If there are abnormalities, may need to do an ultrasound of the right upper quadrant to evaluate for gallstones. We'll treat with GI cocktail  for now.    ----------------------------------------- 4:38 PM on 04/04/2016 -----------------------------------------  Labs unremarkable. Second troponin negative. Pain improved. We'll discharge home follow up with cardiology and primary care. Patient's very well-appearing and wants to go home. Continue GI regimen. Return precautions given.    Clinical Course   ____________________________________________   FINAL CLINICAL IMPRESSION(S) / ED DIAGNOSES  Final diagnoses:  Atypical chest pain  Gastroesophageal reflux disease, esophagitis presence not specified       Portions of this note were generated with dragon dictation software. Dictation errors may occur despite best attempts at proofreading.    Carrie Mew, MD 04/04/16 [phone]

## 2016-04-05 ENCOUNTER — Telehealth: Payer: Self-pay

## 2016-04-05 NOTE — Telephone Encounter (Signed)
Called patient to make ED apt seen in ED for CP  Pt states she will call back to schedule an appointment when she can get a ride

## 2016-04-10 ENCOUNTER — Encounter: Payer: Self-pay | Admitting: Cardiology

## 2016-04-10 ENCOUNTER — Ambulatory Visit (INDEPENDENT_AMBULATORY_CARE_PROVIDER_SITE_OTHER): Payer: Medicare Other | Admitting: Cardiology

## 2016-04-10 VITALS — BP 160/58 | HR 64 | Ht 63.0 in | Wt 142.5 lb

## 2016-04-10 DIAGNOSIS — R079 Chest pain, unspecified: Secondary | ICD-10-CM | POA: Diagnosis not present

## 2016-04-10 DIAGNOSIS — I10 Essential (primary) hypertension: Secondary | ICD-10-CM

## 2016-04-10 DIAGNOSIS — R0602 Shortness of breath: Secondary | ICD-10-CM

## 2016-04-10 MED ORDER — ISOSORBIDE MONONITRATE ER 30 MG PO TB24: 30.0000 mg | ORAL_TABLET | Freq: Every day | ORAL | 6 refills | Status: DC

## 2016-04-10 NOTE — Patient Instructions (Addendum)
Medication Instructions:  Your physician has recommended you make the following change in your medication:  1. START Imdur (Isosorbide mononitrate) Once daily at bedtime.  Testing/Procedures: Your physician has requested that you have an echocardiogram. Echocardiography is a painless test that uses sound waves to create images of your heart. It provides your doctor with information about the size and shape of your heart and how well your heart's chambers and valves are working. This procedure takes approximately one hour. There are no restrictions for this procedure.  Follow-Up: Your physician recommends that you schedule a follow-up appointment in: 1 month with Dr. Yvone Neu.  It was a pleasure seeing you today here in the office. Please do not hesitate to give [place] a call back if you have any further questions. [place], BSN       Echocardiogram An echocardiogram, or echocardiography, uses sound waves (ultrasound) to produce an image of your heart. The echocardiogram is simple, painless, obtained within a short period of time, and offers valuable information to your health care provider. The images from an echocardiogram can provide information such as:  Evidence of coronary artery disease (CAD).  Heart size.  Heart muscle function.  Heart valve function.  Aneurysm detection.  Evidence of a past heart attack.  Fluid buildup around the heart.  Heart muscle thickening.  Assess heart valve function. LET [facility] CARE PROVIDER KNOW ABOUT:  Any allergies you have.  All medicines you are taking, including vitamins, herbs, eye drops, creams, and over-the-counter medicines.  Previous problems you or members of your family have had with the use of anesthetics.  Any blood disorders you have.  Previous surgeries you have had.  Medical conditions you have.  Possibility of pregnancy, if this applies. BEFORE THE PROCEDURE  No special preparation is needed.  Eat and drink normally.  PROCEDURE   In order to produce an image of your heart, gel will be applied to your chest and a wand-like tool (transducer) will be moved over your chest. The gel will help transmit the sound waves from the transducer. The sound waves will harmlessly bounce off your heart to allow the heart images to be captured in real-time motion. These images will then be recorded.  You may need an IV to receive a medicine that improves the quality of the pictures. AFTER THE PROCEDURE You may return to your normal schedule including diet, activities, and medicines, unless your health care provider tells you otherwise.   This information is not intended to replace advice given to you by your health care provider. Make sure you discuss any questions you have with your health care provider.   Document Released: 07/07/2000 Document Revised: 07/31/2014 Document Reviewed: 03/17/2013 Elsevier Interactive Patient Education [employer].

## 2016-04-10 NOTE — Progress Notes (Signed)
Cardiology Office Note   Date:  04/10/2016   ID:  Toni Paul, DOB 1923-04-22, MRN [id number]  Referring Doctor:  Renee Rival, NP   Cardiologist:   Wende Bushy, MD   Reason for consultation:  Chief Complaint  Patient presents with  . other    C/o rib cage pain and swelling. Meds reviewed verbally with pt.      History of Present Illness: Toni Paul is a 82 y.o. female who presents for  Evaluation of chest pain.  Patient presented to urgent care and subsequently sent to ER on 04/04/2016 for chest pain. She describes the pain as a stabbing kind of pain in the center of the chest, radiating down under the breast area, and radiating to the back. This was severe in intensity lasting a few minutes at a time. First noted a week before presentation to the ER. There is some shortness of breath but this shortness of breath is chronic. She was given sucralfate and famotidine in the year with some relief, however there is recurrence of chest pain. Troponins 2 were negative in the ER. She was asked to follow-up in cardiology for further evaluation  She denies PND, orthopnea, abdominal pain. No palpitations or syncope.   ROS:  Please see the history of present illness. Aside from mentioned under HPI, all other systems are reviewed and negative.     Past Medical History:  Diagnosis Date  . Endometrial cancer ([place])   . UTI (urinary tract infection)     Past Surgical History:  Procedure Laterality Date  . TOTAL ABDOMINAL HYSTERECTOMY W/ BILATERAL SALPINGOOPHORECTOMY       reports that she has never smoked. She has never used smokeless tobacco. She reports that she does not drink alcohol or use drugs.   family history is not on file.  No known CAD or SCD in the family  Outpatient Medications Prior to Visit  Medication Sig Dispense Refill  . acetaminophen (TYLENOL) 500 MG chewable tablet Chew 500 mg by mouth every 6 (six) hours as needed for pain.    Marland Kitchen  aspirin 81 MG EC tablet Take 81 mg by mouth daily.     . calcium carbonate (OS-CAL - DOSED IN MG OF ELEMENTAL CALCIUM) 1250 (500 Ca) MG tablet Take 1 tablet by mouth 2 (two) times daily with a meal.    . clobetasol ointment (TEMOVATE) AB-123456789 % Apply 1 application topically at bedtime.    Marland Kitchen dexlansoprazole (DEXILANT) 60 MG capsule Take 60 mg by mouth daily.    . famotidine (PEPCID) 20 MG tablet Take 1 tablet (20 mg total) by mouth 2 (two) times daily. 60 tablet 0  . losartan (COZAAR) 100 MG tablet Take 100 mg by mouth daily.    Marland Kitchen neomycin-polymyxin-hydrocortisone (CORTISPORIN) 3.5-10000-1 otic suspension Place 3 drops into the left ear 4 (four) times daily.    . sucralfate (CARAFATE) 1 g tablet Take 1 tablet (1 g total) by mouth 4 (four) times daily. 120 tablet 1  . Travoprost, BAK Free, (TRAVATAN) 0.004 % SOLN ophthalmic solution Place 1 drop into both eyes at bedtime.     No facility-administered medications prior to visit.      Allergies: Review of patient's allergies indicates no known allergies.    PHYSICAL EXAM: VS:  BP (!) 160/58 (BP Location: Right Arm, Patient Position: Sitting, Cuff Size: Normal)   Pulse 64   Ht 5\' 3"  (1.6 m)   Wt 142 lb 8 oz (64.6 kg)  BMI 25.24 kg/m  , Body mass index is 25.24 kg/m. Wt Readings from Last 3 Encounters:  04/10/16 142 lb 8 oz (64.6 kg)  04/04/16 144 lb (65.3 kg)  02/28/16 140 lb (63.5 kg)    GENERAL:  well developed, well nourished, not in acute distress HEENT: normocephalic, pink conjunctivae, anicteric sclerae, no xanthelasma, normal dentition, oropharynx clear NECK:  no neck vein engorgement, JVP normal, no hepatojugular reflux, carotid upstroke brisk and symmetric, no bruit, no thyromegaly, no lymphadenopathy LUNGS:  good respiratory effort, clear to auscultation bilaterally CV:  PMI not displaced, no thrills, no lifts, S1 and S2 within normal limits, no palpable S3 or S4,  Soft systolic murmur, no rubs, no gallops ABD:  Soft, nontender,  nondistended, normoactive bowel sounds, no abdominal aortic bruit, no hepatomegaly, no splenomegaly MS: nontender back, no kyphosis, no scoliosis, no joint deformities EXT:  2+ DP/PT pulses, no edema, no varicosities, no cyanosis, no clubbing SKIN: warm, nondiaphoretic, normal turgor, no ulcers NEUROPSYCH: alert, oriented to person, place, and time, sensory/motor grossly intact, normal mood, appropriate affect  Recent Labs: 04/04/2016: ALT 10; BUN 13; Creatinine, Ser 0.61; Hemoglobin 12.6; Platelets 276; Potassium 3.9; Sodium 143   Lipid Panel No results found for: CHOL, TRIG, HDL, CHOLHDL, VLDL, LDLCALC, LDLDIRECT   Other studies Reviewed:  EKG:  The ekg from  04/04/2016 1218 was personally reviewed by me and it revealed  Sinus rhythm, 61 BPM. Q waves in V1 and V2, cannot rule out septal infarct.  Additional studies/ records that were reviewed personally reviewed by me today include:  None available   ASSESSMENT AND PLAN:   chest pain  shortness of breath  risk factors for CAD include advanced age, hypertension  as cause options of doing a trial with medical therapy versus proceeding with noninvasive testing such as stress testing. Patient will like to try medications first. Recommend Imdur ER 30 mg by mouth daily at bedtime. Patient advised regarding possible headache from this medication. Recommend echocardiogram Recommend follow-up in one month or sooner.  Recommend to call 911 for unrelenting chest pain.   hypertension BP usually much better at home. There may be element of whitecoat hypertension.  Current medicines are reviewed at length with the patient today.  The patient does not have concerns regarding medicines.  Labs/ tests ordered today include:  Orders Placed This Encounter  Procedures  . ECHOCARDIOGRAM COMPLETE    I had a lengthy and detailed discussion with the patient regarding diagnoses, prognosis, diagnostic options, treatment options , and side effects of  medications.    Disposition:   FU with undersigned  In one month  Signed, Wende Bushy, MD  04/10/2016 2:14 PM    [place]  This note was generated in part with voice recognition software and I apologize for any typographical errors that were not detected and corrected.

## 2016-05-01 ENCOUNTER — Ambulatory Visit (INDEPENDENT_AMBULATORY_CARE_PROVIDER_SITE_OTHER): Payer: Medicare Other

## 2016-05-01 ENCOUNTER — Other Ambulatory Visit: Payer: Self-pay

## 2016-05-01 DIAGNOSIS — R079 Chest pain, unspecified: Secondary | ICD-10-CM

## 2016-05-01 DIAGNOSIS — R0602 Shortness of breath: Secondary | ICD-10-CM

## 2016-05-09 ENCOUNTER — Ambulatory Visit (INDEPENDENT_AMBULATORY_CARE_PROVIDER_SITE_OTHER): Payer: Medicare Other | Admitting: Cardiology

## 2016-05-09 ENCOUNTER — Encounter: Payer: Self-pay | Admitting: Cardiology

## 2016-05-09 VITALS — BP 124/64 | HR 67 | Ht 63.0 in | Wt 142.4 lb

## 2016-05-09 DIAGNOSIS — R079 Chest pain, unspecified: Secondary | ICD-10-CM

## 2016-05-09 DIAGNOSIS — I10 Essential (primary) hypertension: Secondary | ICD-10-CM

## 2016-05-09 NOTE — Progress Notes (Signed)
Cardiology Office Note   Date:  05/10/2016   ID:  Toni Paul, DOB 01/10/1923, MRN [id number]  Referring Doctor:  Renee Rival, NP   Cardiologist:   Wende Bushy, MD   Reason for consultation:  Chief Complaint  Patient presents with  . Follow-up    Chest pain      History of Present Illness: Toni Paul is a 82 y.o. female who presents for  Follow-up after initiation of imdur, for chest pain   Per patient, she has been taking her Imdur. She has noted some improvement in her chest pain. She continues to have episodes of moderate intensity, associated with bending down or picking up things. She continues to be bothered by the chest pain.  She denies PND, orthopnea, abdominal pain. No palpitations or syncope.   ROS:  Please see the history of present illness. Aside from mentioned under HPI, all other systems are reviewed and negative.   Past Medical History:  Diagnosis Date  . Endometrial cancer ([place])   . UTI (urinary tract infection)     Past Surgical History:  Procedure Laterality Date  . TOTAL ABDOMINAL HYSTERECTOMY W/ BILATERAL SALPINGOOPHORECTOMY       reports that she has never smoked. She has never used smokeless tobacco. She reports that she does not drink alcohol or use drugs.   family history is not on file.  No known CAD or SCD in the family  Outpatient Medications Prior to Visit  Medication Sig Dispense Refill  . acetaminophen (TYLENOL) 500 MG chewable tablet Chew 500 mg by mouth every 6 (six) hours as needed for pain.    Marland Kitchen aspirin 81 MG EC tablet Take 81 mg by mouth daily.     . calcium carbonate (OS-CAL - DOSED IN MG OF ELEMENTAL CALCIUM) 1250 (500 Ca) MG tablet Take 1 tablet by mouth 2 (two) times daily with a meal.    . clobetasol ointment (TEMOVATE) AB-123456789 % Apply 1 application topically at bedtime.    Marland Kitchen dexlansoprazole (DEXILANT) 60 MG capsule Take 60 mg by mouth daily.    . famotidine (PEPCID) 20 MG tablet Take 1 tablet (20  mg total) by mouth 2 (two) times daily. 60 tablet 0  . isosorbide mononitrate (IMDUR) 30 MG 24 hr tablet Take 1 tablet (30 mg total) by mouth at bedtime. 30 tablet 6  . losartan (COZAAR) 100 MG tablet Take 100 mg by mouth daily.    Marland Kitchen neomycin-polymyxin-hydrocortisone (CORTISPORIN) 3.5-10000-1 otic suspension Place 3 drops into the left ear 4 (four) times daily.    . sucralfate (CARAFATE) 1 g tablet Take 1 tablet (1 g total) by mouth 4 (four) times daily. 120 tablet 1  . Travoprost, BAK Free, (TRAVATAN) 0.004 % SOLN ophthalmic solution Place 1 drop into both eyes at bedtime.     No facility-administered medications prior to visit.      Allergies: Review of patient's allergies indicates no known allergies.    PHYSICAL EXAM: VS:  BP 124/64 (BP Location: Left Arm, Patient Position: Sitting, Cuff Size: Normal)   Pulse 67   Ht 5\' 3"  (1.6 m)   Wt 142 lb 6.4 oz (64.6 kg)   BMI 25.23 kg/m  , Body mass index is 25.23 kg/m. Wt Readings from Last 3 Encounters:  05/09/16 142 lb 6.4 oz (64.6 kg)  04/10/16 142 lb 8 oz (64.6 kg)  04/04/16 144 lb (65.3 kg)    GENERAL:  well developed, well nourished, not in acute distress HEENT:  normocephalic, pink conjunctivae, anicteric sclerae, no xanthelasma, normal dentition, oropharynx clear NECK:  no neck vein engorgement, JVP normal, no hepatojugular reflux, carotid upstroke brisk and symmetric, no bruit, no thyromegaly, no lymphadenopathy LUNGS:  good respiratory effort, clear to auscultation bilaterally CV:  PMI not displaced, no thrills, no lifts, S1 and S2 within normal limits, no palpable S3 or S4,  Soft systolic murmur, no rubs, no gallops ABD:  Soft, nontender, nondistended, normoactive bowel sounds, no abdominal aortic bruit, no hepatomegaly, no splenomegaly MS: nontender back, no kyphosis, no scoliosis, no joint deformities EXT:  2+ DP/PT pulses, no edema, no varicosities, no cyanosis, no clubbing SKIN: warm, nondiaphoretic, normal turgor, no  ulcers NEUROPSYCH: alert, oriented to person, place, and time, sensory/motor grossly intact, normal mood, appropriate affect  Recent Labs: 04/04/2016: ALT 10; BUN 13; Creatinine, Ser 0.61; Hemoglobin 12.6; Platelets 276; Potassium 3.9; Sodium 143   Lipid Panel No results found for: CHOL, TRIG, HDL, CHOLHDL, VLDL, LDLCALC, LDLDIRECT   Other studies Reviewed:  EKG:  The ekg from  04/04/2016 1218 was personally reviewed by me and it revealed  Sinus rhythm, 61 BPM. Q waves in V1 and V2, cannot rule out septal infarct.  Additional studies/ records that were reviewed personally reviewed by me today include:  Echo 05/01/2016: Left ventricle: The cavity size was normal. Systolic function was   normal. The estimated ejection fraction was in the range of 60%   to 65%. Wall motion was normal; there were no regional wall   motion abnormalities. Doppler parameters are consistent with   abnormal left ventricular relaxation (grade 1 diastolic   dysfunction). - Mitral valve: There was mild regurgitation. - Right ventricle: Systolic function was normal. - Pulmonary arteries: Systolic pressure was within the normal   range.   ASSESSMENT AND PLAN:   chest pain  shortness of breath  risk factors for CAD include advanced age, hypertension Discussed several options to address persistence of chest pain. We discussed possibility of continuing with Imdur at a higher dose, avoiding activities that bring on chest pain and continuing with same dose of Imdur, proceeding with noninvasive testing such as a stress test. Patient would like to try continuing the same dose of Imdur ER 30 mg by mouth daily, to be taken in the daytime. She will try to avoid exerting herself. She would not like to proceed with stress testing at this time. Recommend follow-up in one month or sooner. Recommend to call 911 for unrelenting chest pain.  Hypertension Blood pressure within goal. Continue monitoring.  Current medicines  are reviewed at length with the patient today.  The patient does not have concerns regarding medicines.  Labs/ tests ordered today include:  No orders of the defined types were placed in this encounter.   I had a lengthy and detailed discussion with the patient regarding diagnoses, prognosis, diagnostic options, treatment options , and side effects of medications.    Disposition:   FU with undersigned  In one month  Signed, Wende Bushy, MD  05/10/2016 10:22 AM    [place]  This note was generated in part with voice recognition software and I apologize for any typographical errors that were not detected and corrected.

## 2016-05-09 NOTE — Patient Instructions (Addendum)
Medication Instructions:  Please take Imdur (isosorbide mononitrate) 30 mg Once daily during the day and monitor your symptoms.   Testing/Procedures: Please let us know if you are interested in having the stress test done.   Follow-Up: Your physician recommends that you schedule a follow-up appointment in: 4-6 weeks with Dr. Yvone Neu.  It was a pleasure seeing you today here in the office. Please do not hesitate to give [place] a call back if you have any further questions. [place], BSN     Pharmacologic Stress Electrocardiogram A pharmacologic stress electrocardiogram is a heart (cardiac) test that uses nuclear imaging to evaluate the blood supply to your heart. This test may also be called a pharmacologic stress electrocardiography. Pharmacologic means that a medicine is used to increase your heart rate and blood pressure.  This stress test is done to find areas of poor blood flow to the heart by determining the extent of coronary artery disease (CAD). Some people exercise on a treadmill, which naturally increases the blood flow to the heart. For those people unable to exercise on a treadmill, a medicine is used. This medicine stimulates your heart and will cause your heart to beat harder and more quickly, as if you were exercising.  Pharmacologic stress tests can help determine:  The adequacy of blood flow to your heart during increased levels of activity in order to clear you for discharge home.  The extent of coronary artery blockage caused by CAD.  Your prognosis if you have suffered a heart attack.  The effectiveness of cardiac procedures done, such as an angioplasty, which can increase the circulation in your coronary arteries.  Causes of chest pain or pressure. LET [facility] CARE PROVIDER KNOW ABOUT:  Any allergies you have.  All medicines you are taking, including vitamins, herbs, eye drops, creams, and over-the-counter medicines.  Previous problems you or  members of your family have had with the use of anesthetics.  Any blood disorders you have.  Previous surgeries you have had.  Medical conditions you have.  Possibility of pregnancy, if this applies.  If you are currently breastfeeding. RISKS AND COMPLICATIONS Generally, this is a safe procedure. However, as with any procedure, complications can occur. Possible complications include:  You develop pain or pressure in the following areas:  Chest.  Jaw or neck.  Between your shoulder blades.  Radiating down your left arm.  Headache.  Dizziness or light-headedness.  Shortness of breath.  Increased or irregular heartbeat.  Low blood pressure.  Nausea or vomiting.  Flushing.  Redness going up the arm and slight pain during injection of medicine.  Heart attack (rare). BEFORE THE PROCEDURE   Avoid all forms of caffeine for 24 hours before your test or as directed by your health care provider. This includes coffee, tea (even decaffeinated tea), caffeinated sodas, chocolate, cocoa, and certain pain medicines.  Follow your health care provider's instructions regarding eating and drinking before the test.  Take your medicines as directed at regular times with water unless instructed otherwise. Exceptions may include:  If you have diabetes, ask how you are to take your insulin or pills. It is common to adjust insulin dosing the morning of the test.  If you are taking beta-blocker medicines, it is important to talk to your health care provider about these medicines well before the date of your test. Taking beta-blocker medicines may interfere with the test. In some cases, these medicines need to be changed or stopped 24 hours or  more before the test.  If you wear a nitroglycerin patch, it may need to be removed prior to the test. Ask your health care provider if the patch should be removed before the test.  If you use an inhaler for any breathing condition, bring it with you  to the test.  If you are an outpatient, bring a snack so you can eat right after the stress phase of the test.  Do not smoke for 4 hours prior to the test or as directed by your health care provider.  Do not apply lotions, powders, creams, or oils on your chest prior to the test.  Wear comfortable shoes and clothing. Let your health care provider know if you were unable to complete or follow the preparations for your test. PROCEDURE   Multiple patches (electrodes) will be put on your chest. If needed, small areas of your chest may be shaved to get better contact with the electrodes. Once the electrodes are attached to your body, multiple wires will be attached to the electrodes, and your heart rate will be monitored.  An IV access will be started. A nuclear trace (isotope) is given. The isotope may be given intravenously, or it may be swallowed. Nuclear refers to several types of radioactive isotopes, and the nuclear isotope lights up the arteries so that the nuclear images are clear. The isotope is absorbed by your body. This results in low radiation exposure.  A resting nuclear image is taken to show how your heart functions at rest.  A medicine is given through the IV access.  A second scan is done about 1 hour after the medicine injection and determines how your heart functions under stress.  During this stress phase, you will be connected to an electrocardiogram machine. Your blood pressure and oxygen levels will be monitored. AFTER THE PROCEDURE   Your heart rate and blood pressure will be monitored after the test.  You may return to your normal schedule, including diet,activities, and medicines, unless your health care provider tells you otherwise.   This information is not intended to replace advice given to you by your health care provider. Make sure you discuss any questions you have with your health care provider.   Document Released: 11/26/2008 Document Revised: 07/15/2013  Document Reviewed: 03/17/2013 Elsevier Interactive Patient Education [employer].

## 2016-05-12 ENCOUNTER — Telehealth: Payer: Self-pay | Admitting: Cardiology

## 2016-05-12 DIAGNOSIS — R079 Chest pain, unspecified: Secondary | ICD-10-CM

## 2016-05-12 NOTE — Telephone Encounter (Signed)
Pt daughter is calling, she is ready to schedule pt stress test. Monday or Wednesday is good for her. Preferably later.

## 2016-05-12 NOTE — Telephone Encounter (Signed)
No order in place. Will forward to MD to advise.

## 2016-05-15 ENCOUNTER — Encounter: Payer: Self-pay | Admitting: *Deleted

## 2016-05-15 NOTE — Telephone Encounter (Signed)
Dr. Yvone Neu had discussed stress test for patient and instructed them to call back if they wanted to move forward with that. Patients daughter calling and ready to schedule testing. Scheduled [place] for 05/22/16 at 08:30AM at [facility]. Reviewed all instructions with her and will mail her a letter with all information. Daughter verbalized understanding of all instructions and had no further questions at this time.

## 2016-05-19 ENCOUNTER — Telehealth: Payer: Self-pay | Admitting: Cardiology

## 2016-05-19 NOTE — Telephone Encounter (Signed)
Attempted to contact patient again. No answer, no voice mail

## 2016-05-19 NOTE — Telephone Encounter (Signed)
Called pt at home number. Female who answered states she has gone to see her son and will be back later today.  Attempted to contact daughter, Elvis Coil. Left message for her to call this office.

## 2016-05-22 ENCOUNTER — Encounter
Admission: RE | Admit: 2016-05-22 | Discharge: 2016-05-22 | Disposition: A | Discharge status: Home / Self Care | Payer: Medicare Other | Source: Ambulatory Visit | Attending: Cardiology | Admitting: Cardiology

## 2016-05-22 DIAGNOSIS — R079 Chest pain, unspecified: Secondary | ICD-10-CM | POA: Diagnosis not present

## 2016-05-22 MED ORDER — TECHNETIUM TC 99M TETROFOSMIN IV KIT
12.6790 | PACK | Freq: Once | INTRAVENOUS | Status: AC | PRN
  Administered 2016-05-22: 12.679 via INTRAVENOUS

## 2016-05-22 MED ORDER — TECHNETIUM TC 99M TETROFOSMIN IV KIT
31.2500 | PACK | Freq: Once | INTRAVENOUS | Status: AC | PRN
  Administered 2016-05-22: 31.25 via INTRAVENOUS

## 2016-05-22 MED ORDER — REGADENOSON 0.4 MG/5ML IV SOLN
0.4000 mg | Freq: Once | INTRAVENOUS | Status: AC
  Administered 2016-05-22: 0.4 mg via INTRAVENOUS

## 2016-05-26 LAB — NM MYOCAR MULTI W/SPECT W/WALL MOTION / EF
Estimated workload: 1 METS
Exercise duration (min): 0 min
Exercise duration (sec): 0 s
LV dias vol: 49 mL (ref 46–106)
LV sys vol: 7 mL
MPHR: 129 {beats}/min
Peak HR: 68 {beats}/min
Percent HR: 64 %
Percent of predicted max HR: 52 %
Rest HR: 54 {beats}/min
SDS: 4
SRS: 5
SSS: 8
Stage 1 Grade: 0 %
Stage 1 HR: 51 {beats}/min
Stage 1 Speed: 0 mph
Stage 2 Grade: 0 %
Stage 2 HR: 51 {beats}/min
Stage 2 Speed: 0 mph
Stage 3 Grade: 0 %
Stage 3 HR: 68 {beats}/min
Stage 3 Speed: 0 mph
Stage 4 Grade: 0 %
Stage 4 HR: 83 {beats}/min
Stage 4 Speed: 0 mph
Stage 5 DBP: 50 mmHg
Stage 5 Grade: 0 %
Stage 5 HR: 71 {beats}/min
Stage 5 SBP: 156 mmHg
Stage 5 Speed: 0 mph
TID: 0.82

## 2016-06-07 ENCOUNTER — Ambulatory Visit: Payer: Medicare Other | Admitting: Cardiology

## 2016-06-13 ENCOUNTER — Ambulatory Visit: Payer: Medicare Other | Admitting: Cardiology

## 2016-06-21 ENCOUNTER — Encounter: Payer: Self-pay | Admitting: Cardiology

## 2016-06-21 ENCOUNTER — Ambulatory Visit (INDEPENDENT_AMBULATORY_CARE_PROVIDER_SITE_OTHER): Payer: Medicare Other | Admitting: Cardiology

## 2016-06-21 VITALS — BP 130/60 | HR 57 | Ht 63.0 in | Wt 140.5 lb

## 2016-06-21 DIAGNOSIS — R079 Chest pain, unspecified: Secondary | ICD-10-CM

## 2016-06-21 DIAGNOSIS — I10 Essential (primary) hypertension: Secondary | ICD-10-CM

## 2016-06-21 NOTE — Progress Notes (Signed)
Cardiology Office Note   Date:  06/21/2016   ID:  Toni Paul, DOB 1922-09-03, MRN [id number]  Referring Doctor:  Renee Rival, NP   Cardiologist:   Wende Bushy, MD   Reason for consultation:  Chief Complaint  Patient presents with  . other    18mo f/u Stress Electrocardiogram. Pt c/o some chest pains since last visit but doing good today.  Reviewed meds with pt verbally.      History of Present Illness: Toni Paul is a 82 y.o. female who presents for  Follow-up after initiation of imdur, for chest pain   Since last visit, she has gone for her nuclear stress test. She also ran out of Imdur, but she has been doing quite well. Only one recurrence of chest pain and none since then. She has not been taking her Carafate and Pepcid.   She denies PND, orthopnea, abdominal pain. No palpitations or syncope.   ROS:  Please see the history of present illness. Aside from mentioned under HPI, all other systems are reviewed and negative.   Past Medical History:  Diagnosis Date  . Endometrial cancer ([place])   . UTI (urinary tract infection)     Past Surgical History:  Procedure Laterality Date  . TOTAL ABDOMINAL HYSTERECTOMY W/ BILATERAL SALPINGOOPHORECTOMY       reports that she has never smoked. She has never used smokeless tobacco. She reports that she does not drink alcohol or use drugs.   family history is not on file.  No known CAD or SCD in the family  Outpatient Medications Prior to Visit  Medication Sig Dispense Refill  . acetaminophen (TYLENOL) 500 MG chewable tablet Chew 500 mg by mouth every 6 (six) hours as needed for pain.    Marland Kitchen aspirin 81 MG EC tablet Take 81 mg by mouth daily.     . calcium carbonate (OS-CAL - DOSED IN MG OF ELEMENTAL CALCIUM) 1250 (500 Ca) MG tablet Take 1 tablet by mouth 2 (two) times daily with a meal.    . clobetasol ointment (TEMOVATE) [id number] % Apply 1 application topically at bedtime.    Marland Kitchen losartan (COZAAR) 100 MG  tablet Take 100 mg by mouth daily.    . Travoprost, BAK Free, (TRAVATAN) 0.004 % SOLN ophthalmic solution Place 1 drop into both eyes at bedtime.    Marland Kitchen dexlansoprazole (DEXILANT) 60 MG capsule Take 60 mg by mouth daily.    . famotidine (PEPCID) 20 MG tablet Take 1 tablet (20 mg total) by mouth 2 (two) times daily. (Patient not taking: Reported on 06/21/2016) 60 tablet 0  . isosorbide mononitrate (IMDUR) 30 MG 24 hr tablet Take 1 tablet (30 mg total) by mouth at bedtime. (Patient not taking: Reported on 06/21/2016) 30 tablet 6  . neomycin-polymyxin-hydrocortisone (CORTISPORIN) 3.5-10000-1 otic suspension Place 3 drops into the left ear 4 (four) times daily.    . sucralfate (CARAFATE) 1 g tablet Take 1 tablet (1 g total) by mouth 4 (four) times daily. (Patient not taking: Reported on 06/21/2016) 120 tablet 1   No facility-administered medications prior to visit.      Allergies: Pollen extract    PHYSICAL EXAM: VS:  BP 130/60 (BP Location: Left Arm, Patient Position: Sitting, Cuff Size: Normal)   Pulse (!) 57   Ht 5\' 3"  (1.6 m)   Wt 140 lb 8 oz (63.7 kg)   BMI 24.89 kg/m  , Body mass index is 24.89 kg/m. Wt Readings from Last 3 Encounters:  06/21/16 140 lb 8 oz (63.7 kg)  05/09/16 142 lb 6.4 oz (64.6 kg)  04/10/16 142 lb 8 oz (64.6 kg)    GENERAL:  well developed, well nourished, not in acute distress HEENT: normocephalic, pink conjunctivae, anicteric sclerae, no xanthelasma, normal dentition, oropharynx clear NECK:  no neck vein engorgement, JVP normal, no hepatojugular reflux, carotid upstroke brisk and symmetric, no bruit, no thyromegaly, no lymphadenopathy LUNGS:  good respiratory effort, clear to auscultation bilaterally CV:  PMI not displaced, no thrills, no lifts, S1 and S2 within normal limits, no palpable S3 or S4,  Soft systolic murmur, no rubs, no gallops ABD:  Soft, nontender, nondistended, normoactive bowel sounds, no abdominal aortic bruit, no hepatomegaly, no  splenomegaly MS: nontender back, no kyphosis, no scoliosis, no joint deformities EXT:  2+ DP/PT pulses, no edema, no varicosities, no cyanosis, no clubbing SKIN: warm, nondiaphoretic, normal turgor, no ulcers NEUROPSYCH: alert, oriented to person, place, and time, sensory/motor grossly intact, normal mood, appropriate affect  Recent Labs: 04/04/2016: ALT 10; BUN 13; Creatinine, Ser 0.61; Hemoglobin 12.6; Platelets 276; Potassium 3.9; Sodium 143   Lipid Panel No results found for: CHOL, TRIG, HDL, CHOLHDL, VLDL, LDLCALC, LDLDIRECT   Other studies Reviewed:  EKG:  The ekg from  04/04/2016 1218 was personally reviewed by me and it revealed  Sinus rhythm, 61 BPM. Q waves in V1 and V2, cannot rule out septal infarct.  Additional studies/ records that were reviewed personally reviewed by me today include:  Echo 05/01/2016: Left ventricle: The cavity size was normal. Systolic function was   normal. The estimated ejection fraction was in the range of 60%   to 65%. Wall motion was normal; there were no regional wall   motion abnormalities. Doppler parameters are consistent with   abnormal left ventricular relaxation (grade 1 diastolic   dysfunction). - Mitral valve: There was mild regurgitation. - Right ventricle: Systolic function was normal. - Pulmonary arteries: Systolic pressure was within the normal   range.  Nuclear stress test 05/22/2016:  There was no ST segment deviation noted during stress.  No T wave inversion was noted during stress.  Defect 1: There is a small defect of mild severity present in the apex location. This is likely due to breast attenuation.  The study is normal.  This is a low risk study.  The left ventricular ejection fraction is normal (55-65%).     ASSESSMENT AND PLAN:   chest pain  shortness of breath  risk factors for CAD include advanced age, hypertension Pharmacologic nuclear stress test did not reveal any evidence of ischemia. Findings  discussed with patient and daughter. Likelihood of clinically significant CAD is low based on this study. Recommend follow-up with PCP for refill of GERD meds. Chest pain may also musculoskeletal in nature. Follow-up with cardiology when necessary.  Hypertension Blood pressure within goal. Continue monitoring.  Current medicines are reviewed at length with the patient today.  The patient does not have concerns regarding medicines.  Labs/ tests ordered today include:  No orders of the defined types were placed in this encounter.   I had a lengthy and detailed discussion with the patient regarding diagnoses, prognosis, diagnostic options, treatment options , and side effects of medications.    Disposition:   FU with undersigned when necessary Signed, Wende Bushy, MD  06/21/2016 12:25 PM    [place]  This note was generated in part with voice recognition software and I apologize for any typographical errors that were  not detected and corrected.

## 2016-06-21 NOTE — Patient Instructions (Signed)
Follow-Up: Your physician recommends that you schedule a follow-up appointment as needed with Dr. Ingal.   It was a pleasure seeing you today here in the office. Please do not hesitate to give us a call back if you have any further questions. [phone]  Pamela A. RN, BSN     

## 2016-09-20 ENCOUNTER — Encounter: Payer: Self-pay | Admitting: Obstetrics and Gynecology

## 2016-09-20 ENCOUNTER — Ambulatory Visit (INDEPENDENT_AMBULATORY_CARE_PROVIDER_SITE_OTHER): Payer: Medicare Other | Admitting: Obstetrics and Gynecology

## 2016-09-20 DIAGNOSIS — R300 Dysuria: Secondary | ICD-10-CM

## 2016-09-20 DIAGNOSIS — N761 Subacute and chronic vaginitis: Secondary | ICD-10-CM | POA: Diagnosis not present

## 2016-09-20 LAB — POCT URINALYSIS DIPSTICK
Blood, UA: NEGATIVE
Glucose, UA: NEGATIVE
Ketones, UA: NEGATIVE
Nitrite, UA: NEGATIVE
Spec Grav, UA: 1.015
Urobilinogen, UA: 0.2
pH, UA: 7.5

## 2016-09-20 LAB — POCT WET PREP WITH KOH
Clue Cells Wet Prep HPF POC: NEGATIVE
RBC Wet Prep HPF POC: NEGATIVE
Trichomonas, UA: NEGATIVE
Yeast Wet Prep HPF POC: NEGATIVE

## 2016-09-20 MED ORDER — TERCONAZOLE 0.4 % VA CREA
1.0000 | TOPICAL_CREAM | Freq: Every day | VAGINAL | 0 refills | Status: DC
Start: 2016-09-20 — End: 2019-02-10

## 2016-09-20 NOTE — Progress Notes (Signed)
HPI:      [name] is a 83 y.o. who LMP was No LMP recorded. Patient is postmenopausal., presents today for a problem visit.  She was seen 09/04/16 for vaginal burning and dysuria. Pt had a neg C&S and wet prep but was treated with macrobid for 7 days and estrace crm ext. She states she was symptom free for a wk but her sx returned 2 days ago. She has ext and internal vaginal burning with and without voiding and a burning sensation in lower pelvic area, with and without voiding. Sx are constant and preventing her from sleeping. She deneis any GI sx, fevers, other urin sx. No vag d/c, odor. She has a hx of LS and treats with clobetasol with sx relief.    Review of Systems  Constitutional: Negative for fever, malaise/fatigue and weight loss.  Gastrointestinal: Negative for blood in stool, constipation, diarrhea, nausea and vomiting.  Genitourinary: Positive for dysuria. Negative for flank pain, frequency, hematuria and urgency.  Musculoskeletal: Negative for back pain.  Skin: Negative for itching and rash.    OBJECTIVE:   Vitals:  BP 130/90 (BP Location: Left Leg, Patient Position: Sitting)   Pulse (!) 55   Ht 5\' 2"  (1.575 m)   Wt 140 lb (63.5 kg)   BMI 25.61 kg/m   Physical Exam  Genitourinary: There is no rash, tenderness, lesion or Bartholin's cyst on the right labia. There is no rash, tenderness, lesion or Bartholin's cyst on the left labia. There is tenderness in the vagina. No erythema or bleeding in the vagina. No vaginal discharge found. Right adnexum does not display mass and does not display tenderness. Left adnexum does not display mass and does not display tenderness.    Genitourinary Comments: Cx and uterus not present  Nursing note and vitals reviewed.   Results: Results for orders placed or performed in visit on 09/20/16 (from the past 24 hour(s))  POCT Urinalysis Dipstick     Status: Abnormal   Collection Time: 09/20/16  9:14 AM  Result Value Ref Range     Color, UA yellow    Clarity, UA     Glucose, UA neg    Bilirubin, UA trace    Ketones, UA neg    Spec Grav, UA 1.015    Blood, UA neg    pH, UA 7.5    Protein, UA trace    Urobilinogen, UA 0.2    Nitrite, UA neg    Leukocytes, UA small (1+) (A) Negative  POCT Wet Prep with KOH     Status: Normal   Collection Time: 09/20/16  9:17 AM  Result Value Ref Range   Trichomonas, UA Negative    Clue Cells Wet Prep HPF POC neg    Epithelial Wet Prep HPF POC  Few, Moderate, Many, Too numerous to count   Yeast Wet Prep HPF POC neg    Bacteria Wet Prep HPF POC  Few   RBC Wet Prep HPF POC neg    WBC Wet Prep HPF POC     KOH Prep POC  Negative     Assessment/Plan:  Subacute vaginitis - Neg wet prep. LS controlled. Check One Swab AV and yeast panel. Will call pt wiht results. Treat empirically for yeast vag. Will refer to MD if sx persist. - Plan: terconazole (TERAZOL 7) 0.4 % vaginal cream, Other/Misc lab test, POCT Wet Prep with KOH  Dysuria - Check C&S to rule out UTI, but C&S neg 3 wks ago.  Sx most likely due to vaginitis. - Plan: Urine culture, Other/Misc lab test, POCT Urinalysis Dipstick    F/U  Return if symptoms worsen or fail to improve.  Yashar Inclan B. Tyshawna Alarid, PA-C 09/20/2016 9:21 AM

## 2016-09-22 LAB — URINE CULTURE

## 2016-09-25 ENCOUNTER — Telehealth: Payer: Self-pay | Admitting: Obstetrics and Gynecology

## 2016-09-25 NOTE — Telephone Encounter (Signed)
Note created in error.

## 2016-09-28 ENCOUNTER — Telehealth: Payer: Self-pay | Admitting: Obstetrics and Gynecology

## 2016-09-28 ENCOUNTER — Other Ambulatory Visit: Payer: Self-pay | Admitting: Obstetrics and Gynecology

## 2016-09-28 DIAGNOSIS — B9689 Other specified bacterial agents as the cause of diseases classified elsewhere: Secondary | ICD-10-CM

## 2016-09-28 DIAGNOSIS — N760 Acute vaginitis: Principal | ICD-10-CM

## 2016-09-28 MED ORDER — AMPICILLIN 500 MG PO CAPS: 500.0000 mg | ORAL_CAPSULE | Freq: Four times a day (QID) | ORAL | 0 refills | Status: AC

## 2016-09-28 NOTE — Telephone Encounter (Signed)
Pt's daughter, Elvis Coil, aware of AV dx. Rx ampicillin sent to pharm. Minimal relief with terazol Rx. One Swab pos for AV (e. Coli), neg for yeast. F/u prn.

## 2016-12-02 IMAGING — CR DG CHEST 2V
1 series · 2 of 2 positions shown · non-contrast
Comparison: 02/28/2016

CLINICAL DATA: Pt has been having chest pain under her left breast
since last night. Hx of bronchitis and hypertension. Non smoker

EXAM:
CHEST  2 VIEW

[Series 1: w chest pa · 0.14mm/px · 2 of 2 slices shown]
[im 1/2]
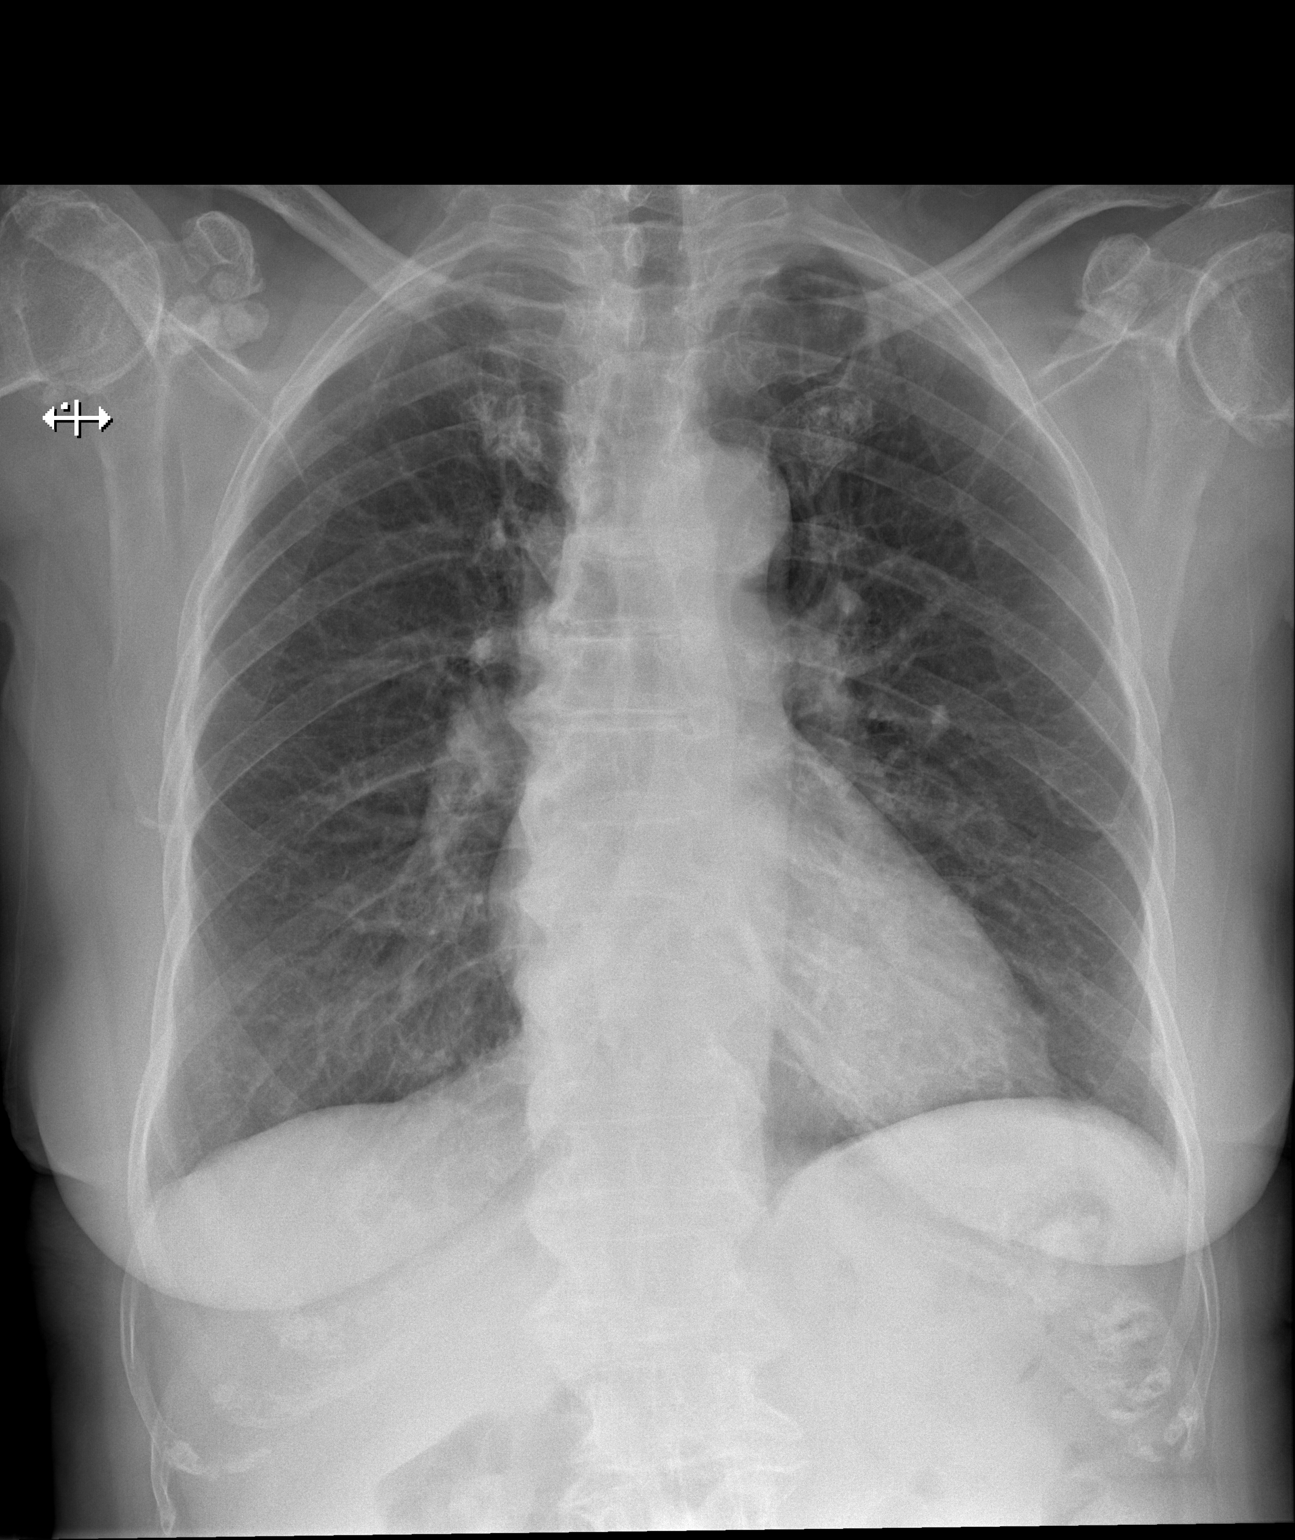
[im 2/2]
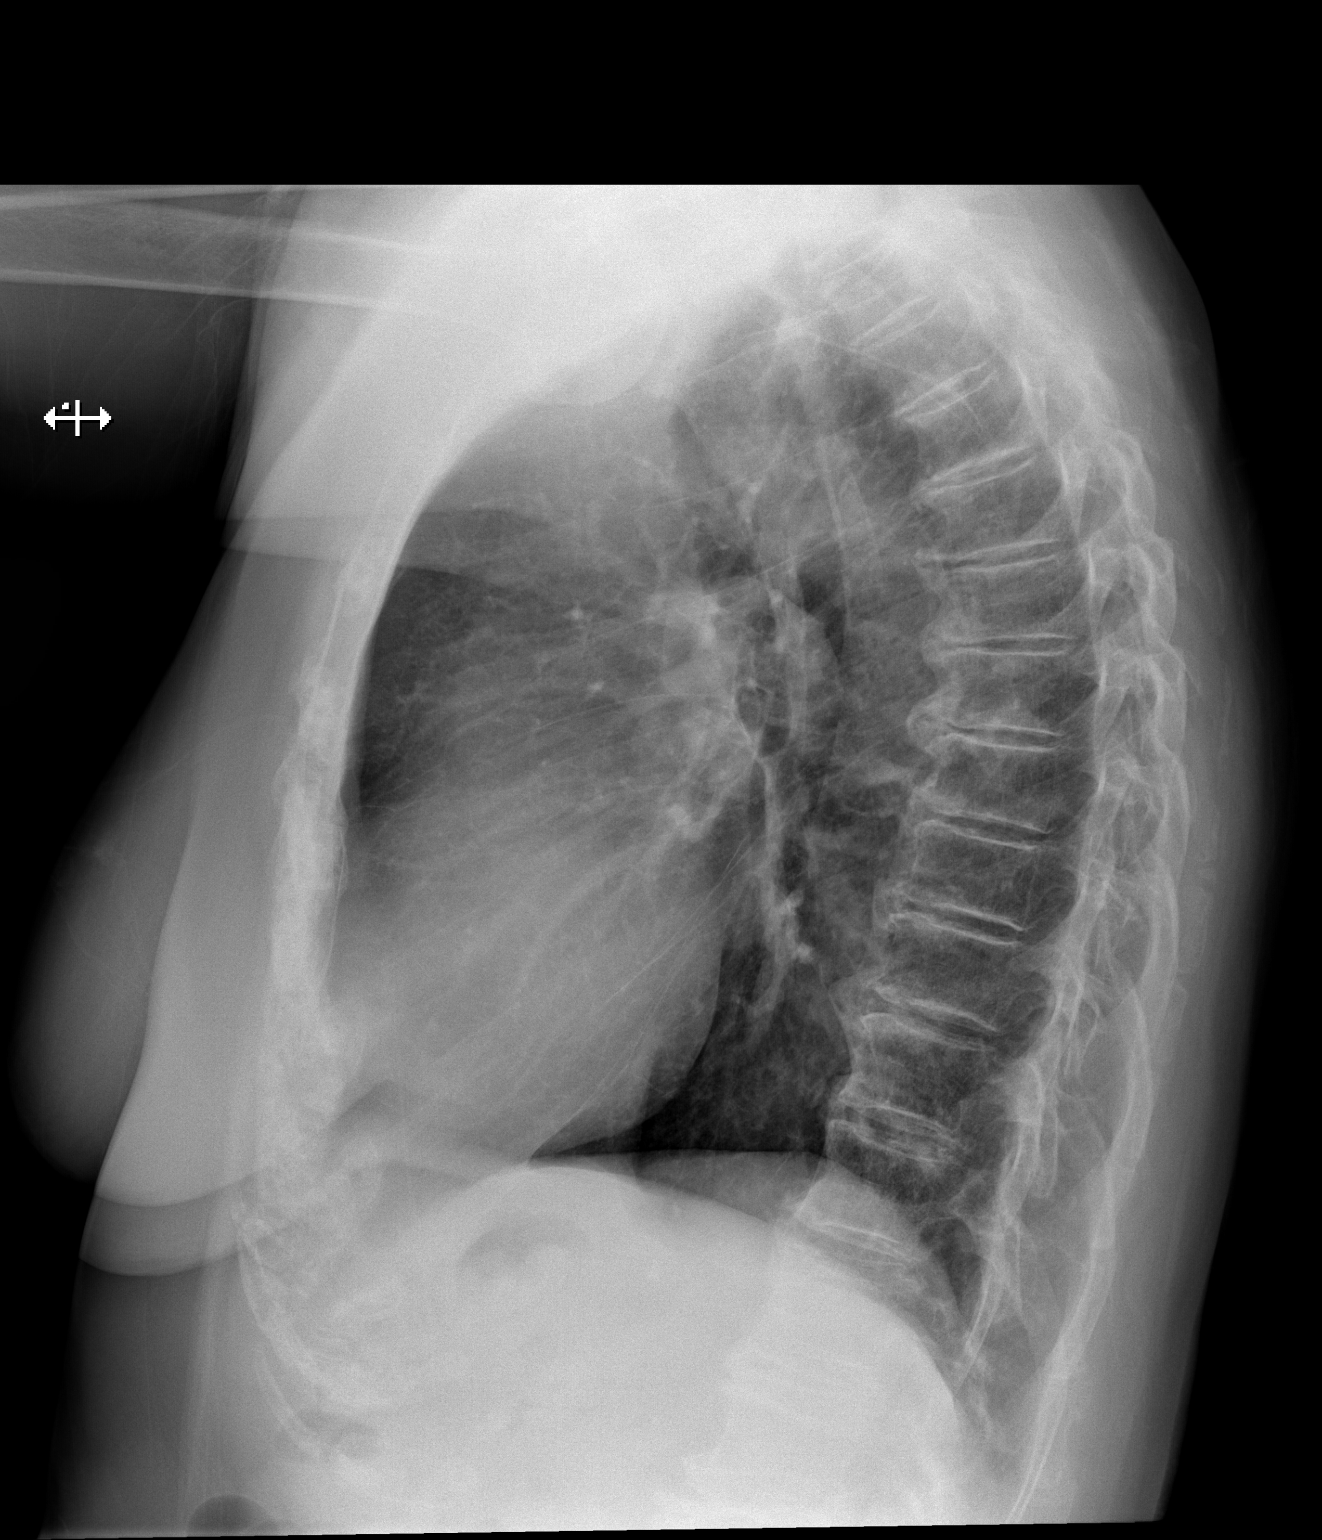

[2 of 2 positions shown; findings below may reference images not displayed]

FINDINGS: Cardiac silhouette is normal in size and configuration.

No mediastinal or hilar masses or evidence of adenopathy.

Clear lungs. Lungs are hyperexpanded. No pleural effusion or
pneumothorax.

Bony thorax is demineralized. There arthropathic changes of both
shoulders, right greater than left.
IMPRESSION: No acute cardiopulmonary disease.

## 2017-02-13 ENCOUNTER — Encounter: Payer: Self-pay | Admitting: Emergency Medicine

## 2017-02-13 ENCOUNTER — Emergency Department
Admission: EM | Admit: 2017-02-13 | Discharge: 2017-02-13 | Disposition: A | Discharge status: Home / Self Care | Payer: Medicare Other | Attending: Emergency Medicine | Admitting: Emergency Medicine

## 2017-02-13 ENCOUNTER — Emergency Department: Payer: Medicare Other

## 2017-02-13 DIAGNOSIS — Z7982 Long term (current) use of aspirin: Secondary | ICD-10-CM | POA: Diagnosis not present

## 2017-02-13 DIAGNOSIS — K2970 Gastritis, unspecified, without bleeding: Secondary | ICD-10-CM

## 2017-02-13 DIAGNOSIS — R1084 Generalized abdominal pain: Secondary | ICD-10-CM | POA: Diagnosis not present

## 2017-02-13 DIAGNOSIS — Z79899 Other long term (current) drug therapy: Secondary | ICD-10-CM | POA: Insufficient documentation

## 2017-02-13 DIAGNOSIS — R109 Unspecified abdominal pain: Secondary | ICD-10-CM | POA: Diagnosis present

## 2017-02-13 LAB — COMPREHENSIVE METABOLIC PANEL
ALT: 11 U/L — ABNORMAL LOW (ref 14–54)
AST: 21 U/L (ref 15–41)
Albumin: 4.2 g/dL (ref 3.5–5.0)
Alkaline Phosphatase: 57 U/L (ref 38–126)
Anion gap: 6 (ref 5–15)
BUN: 14 mg/dL (ref 6–20)
CO2: 25 mmol/L (ref 22–32)
Calcium: 9.2 mg/dL (ref 8.9–10.3)
Chloride: 111 mmol/L (ref 101–111)
Creatinine, Ser: 0.74 mg/dL (ref 0.44–1.00)
GFR calc Af Amer: >60 mL/min (ref >60)
GFR calc non Af Amer: >60 mL/min (ref >60)
Glucose, Bld: 99 mg/dL (ref 65–99)
Potassium: 3.7 mmol/L (ref 3.5–5.1)
Sodium: 142 mmol/L (ref 135–145)
Total Bilirubin: 0.7 mg/dL (ref 0.3–1.2)
Total Protein: 6.6 g/dL (ref 6.5–8.1)

## 2017-02-13 LAB — URINALYSIS, COMPLETE (UACMP) WITH MICROSCOPIC
Bacteria, UA: NONE SEEN
Bilirubin Urine: NEGATIVE
Glucose, UA: NEGATIVE mg/dL
Hgb urine dipstick: NEGATIVE
Ketones, ur: NEGATIVE mg/dL
Nitrite: NEGATIVE
Protein, ur: NEGATIVE mg/dL
Specific Gravity, Urine: 1.019 (ref 1.005–1.030)
pH: 6 (ref 5.0–8.0)

## 2017-02-13 LAB — CBC
HCT: 37.3 % (ref 35.0–47.0)
Hemoglobin: 12.6 g/dL (ref 12.0–16.0)
MCH: 30.4 pg (ref 26.0–34.0)
MCHC: 33.7 g/dL (ref 32.0–36.0)
MCV: 90.2 fL (ref 80.0–100.0)
Platelets: 260 10*3/uL (ref 150–440)
RBC: 4.13 MIL/uL (ref 3.80–5.20)
RDW: 14 % (ref 11.5–14.5)
WBC: 5.8 10*3/uL (ref 3.6–11.0)

## 2017-02-13 LAB — LACTIC ACID, PLASMA: Lactic Acid, Venous: 1 mmol/L (ref 0.5–1.9)

## 2017-02-13 LAB — LIPASE, BLOOD: Lipase: 25 U/L (ref 11–51)

## 2017-02-13 LAB — TROPONIN I: Troponin I: <0.03 ng/mL (ref <0.03)

## 2017-02-13 MED ORDER — IOPAMIDOL (ISOVUE-370) INJECTION 76%
100.0000 mL | Freq: Once | INTRAVENOUS | Status: AC | PRN
  Administered 2017-02-13: 100 mL via INTRAVENOUS

## 2017-02-13 MED ORDER — GI COCKTAIL ~~LOC~~
30.0000 mL | Freq: Once | ORAL | Status: AC
  Administered 2017-02-13: 30 mL via ORAL
  Filled 2017-02-13: qty 30

## 2017-02-13 NOTE — ED Provider Notes (Signed)
[facility] Emergency Department Provider Note ____________________________________________   I have reviewed the triage vital signs and the triage nursing note.  HISTORY  Chief Complaint Abdominal Pain   Historian Patient and son at the bedside  HPI Toni Paul is a 83 y.o. female with a history of endometrial cancer, degenerative disc disease of the spine, presents with a complaint of abdominal pain which she describes as burning and diffuse. She does not really describe on its  Worse, but it seems. Be more stronger and more frequent over the past 2 weeks. When asked to point to where her symptoms are she points to her left flank. She states that it does sometimes go down her left leg. Patellar ask her again she states that the pain is generalized diffuse across her abdomen.  Denies chest pain or coughing or trouble breathing. The burning sensation is also across her epigastrium.  Denies black or bloody stools.  Pain is currently moderate.    Past Medical History:  Diagnosis Date  . Endometrial cancer ([place])   . UTI (urinary tract infection)     Patient Active Problem List   Diagnosis Date Noted  . AV (anaerobic vaginosis) 09/28/2016  . CERVICALGIA 03/21/2010  . RUPTURE ROTATOR CUFF 03/21/2010  . Osteoarthrosis, unspecified whether generalized or localized, shoulder region 09/15/2009  . 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 IMPINGEMENT SYNDROME 09/15/2009    Past Surgical History:  Procedure Laterality Date  . TOTAL ABDOMINAL HYSTERECTOMY W/ BILATERAL SALPINGOOPHORECTOMY      Prior to Admission medications   Medication Sig Start Date End Date Taking? Authorizing Provider  acetaminophen (TYLENOL) 500 MG chewable tablet Chew 500 mg by mouth every 6 (six) hours as needed for pain.    [provider]  aspirin EC 81 MG tablet Take by mouth.    [provider]  calcium carbonate (OS-CAL - DOSED IN MG OF  ELEMENTAL CALCIUM) 1250 (500 Ca) MG tablet Take 1 tablet by mouth 2 (two) times daily with a meal.    [provider]  clobetasol ointment (TEMOVATE) 0.05 % Apply topically.    [provider]  dexlansoprazole (DEXILANT) 60 MG capsule Take 60 mg by mouth daily.    [provider]  estradiol (ESTRACE) 0.1 MG/GM vaginal cream Place 1 Applicatorful vaginally at bedtime.    [provider]  losartan (COZAAR) 100 MG tablet Take 100 mg by mouth daily.    [provider]  simvastatin (ZOCOR) 20 MG tablet Take 20 mg by mouth daily.    [provider]  terconazole (TERAZOL 7) 0.4 % vaginal cream Place 1 applicator vaginally at bedtime. 3/66/44   Copland, Alicia B, PA-C  Travoprost, BAK Free, (TRAVATAN) 0.004 % SOLN ophthalmic solution Place 1 drop into both eyes at bedtime.    [provider]    Allergies  Allergen Reactions  . Pollen Extract     History reviewed. No pertinent family history.  Social History Social History  Substance Use Topics  . Smoking status: Never Smoker  . Smokeless tobacco: Never Used  . Alcohol use No    Review of Systems  Constitutional: Negative for fever. Eyes: Negative for visual changes. ENT: Negative for sore throat. Cardiovascular: Negative for chest pain. Respiratory: Negative for shortness of breath. Gastrointestinal: Negative for vomiting and diarrhea. Genitourinary: Negative for dysuria. Musculoskeletal: Negative for new back pain. Skin: Negative for rash. Neurological: Negative for headache.  ____________________________________________   PHYSICAL EXAM:  VITAL SIGNS: ED  Triage Vitals  Enc Vitals Group     BP 02/13/17 1256 140/69     Pulse Rate 02/13/17 1256 64     Resp 02/13/17 1256 18     Temp 02/13/17 1256 98.3 F (36.8 C)     Temp Source 02/13/17 1256 Oral     SpO2 02/13/17 1256 100 %     Weight 02/13/17 1226 140 lb (63.5 kg)     Height 02/13/17 1256 5\' 3"  (1.6 m)      Head Circumference --      Peak Flow --      Pain Score 02/13/17 1226 8     Pain Loc --      Pain Edu? --      Excl. in [place]? --      Constitutional: Alert and oriented. Well appearing and in no distress. HEENT   Head: Normocephalic and atraumatic.      Eyes: Conjunctivae are normal. Pupils equal and round.       Ears:         Nose: No congestion/rhinnorhea.   Mouth/Throat: Mucous membranes are moist.   Neck: No stridor. Cardiovascular/Chest: Normal rate, regular rhythm.  No murmurs, rubs, or gallops. Respiratory: Normal respiratory effort without tachypnea nor retractions. Breath sounds are clear and equal bilaterally. No wheezes/rales/rhonchi. Gastrointestinal: Soft. No distention, no guarding, no rebound.  Reports pain with palpation although does not seem particularly sore upon deep or superficial palpation.  Genitourinary/rectal:Deferred Musculoskeletal: Nontender with normal range of motion in all extremities. No joint effusions.  No lower extremity tenderness.  No edema. Neurologic:  Normal speech and language. No gross or focal neurologic deficits are appreciated. Skin:  Skin is warm, dry and intact. No rash noted. Psychiatric: Mood and affect are normal. Speech and behavior are normal. Patient exhibits appropriate insight and judgment.   ____________________________________________  LABS (pertinent positives/negatives)  Labs Reviewed  COMPREHENSIVE METABOLIC PANEL - Abnormal; Notable for the following:       Result Value   ALT 11 (*)    All other components within normal limits  URINALYSIS, COMPLETE (UACMP) WITH MICROSCOPIC - Abnormal; Notable for the following:    Color, Urine YELLOW (*)    APPearance CLEAR (*)    Leukocytes, UA TRACE (*)    Squamous Epithelial / LPF 0-5 (*)    All other components within normal limits  LIPASE, BLOOD  CBC  TROPONIN I  LACTIC ACID, PLASMA  LACTIC ACID, PLASMA    ____________________________________________     EKG I, Lisa Roca, MD, the attending physician have personally viewed and interpreted all ECGs.  60 bpm. Normal sinus rhythm. Narrow QRS. Normal axis. Normal ST and T-wave. There is a Q wave anteriorly. ____________________________________________  RADIOLOGY All Xrays were viewed by me. Imaging interpreted by Radiologist.  CT angio abdomen/pelvis: IMPRESSION: VASCULAR  No evidence of significant mesenteric arterial occlusive disease. Minimal narrowing of the celiac axis present.  NON-VASCULAR  No acute process identified in the abdomen or pelvis. __________________________________________  PROCEDURES  Procedure(s) performed: None  Critical Care performed: None  ____________________________________________   ED COURSE / ASSESSMENT AND PLAN  Pertinent labs & imaging results that were available during my care of the patient were reviewed by me and considered in my medical decision making (see chart for details).   Mrs. Ragen's complaint is a little bit difficult to discern given that the description of burning and a sounds more like possible gastritis, but pointing to the left low back and possible down the  leg seems more like sciatica, but then at one point she also told me that she has generalized abdominal pain.  Given her age, I did discuss obtaining CT to rule out mesenteric ischemia.  Given my first impression was GERD/gastritis, I did give her a GI cocktail she reported significant improvement with that.  I doubt cardiac cause, EKG and troponin are reassuring.  No focal weakness or numbness. All day she is having an acute neurologic emergency.  With evaluation around 4:30, patient is improved, I am going to go ahead and discharge from the emergency department today. I suspect gastritis was the source of the discomfort today.    CONSULTATIONS:  None Patient / Family / Caregiver informed of clinical course, medical decision-making process, and agree with  plan.   I discussed return precautions, follow-up instructions, and discharge instructions with patient and/or family.  Discharge Instructions : You were evaluated for abdominal burning, and although no certain cause was found, you exam and evaluation are overall was reassuring in the emergency room today.  We discussed that upper abdominal burning could be related to acid reflux. You could try over-the-counter Tums or Maalox to help with symptoms. Please follow-up with the primary care doctor and possibly a gastroenterologist.  We discussed the left lower back pain into the leg could be due to pinched nerve called sciatica. Please follow-up with the primary care doctor. Return to the emergency room immediately for any worsening pain, black or bloody stool, bloody vomiting, numbness, weakness, fever, or any other symptoms concerning to you.  ___________________________________________   FINAL CLINICAL IMPRESSION(S) / ED DIAGNOSES   Final diagnoses:  Generalized abdominal pain              Note: This dictation was prepared with Dragon dictation. Any transcriptional errors that result from this process are unintentional    Lisa Roca, MD 02/13/17 [phone]

## 2017-02-13 NOTE — ED Notes (Signed)
Pt states generalized abd pain x 2 weeks, worse since 1am. Denies diarrhea, vomiting, fever. States some nausea. Pt denies hx of diverticulosis/itis, denies any liver or kidney or pancreatic problems. States appendix is gone. Still has gallbladder. Denies SBO hx. Denies urinary symptoms- states it always burns a little. Pt states pain on L side of belly when she moves. Family member states pt eats a lot of tomatoes and the clinic thought that maybe the seeds from tomatoes had caused diverticulitis.

## 2017-02-13 NOTE — Discharge Instructions (Addendum)
You were evaluated for abdominal burning, and although no certain cause was found, your exam and evaluation are overall reassuring in the emergency room today.  We discussed that upper abdominal burning could be related to acid reflux. You could try over-the-counter Tums or Maalox to help with symptoms. Please follow-up with the primary care doctor and possibly a gastroenterologist.  We discussed the left lower back pain into the leg could be due to pinched nerve called sciatica. Please follow-up with the primary care doctor. Return to the emergency room immediately for any worsening pain, black or bloody stool, bloody vomiting, numbness, weakness, fever, or any other symptoms concerning to you.

## 2017-02-13 NOTE — ED Triage Notes (Signed)
Pt to ed with c/o abd pain, burning intermittently x 1 week.  Pt also reports worse last week, constant rated 8/10, nausea yesterday, denies vomiting.

## 2017-09-26 ENCOUNTER — Encounter: Payer: Self-pay | Admitting: Emergency Medicine

## 2017-09-26 ENCOUNTER — Emergency Department
Admission: EM | Admit: 2017-09-26 | Discharge: 2017-09-26 | Disposition: A | Discharge status: Home / Self Care | Payer: Medicare Other | Attending: Emergency Medicine | Admitting: Emergency Medicine

## 2017-09-26 ENCOUNTER — Emergency Department: Payer: Medicare Other

## 2017-09-26 DIAGNOSIS — Z7982 Long term (current) use of aspirin: Secondary | ICD-10-CM | POA: Insufficient documentation

## 2017-09-26 DIAGNOSIS — M79604 Pain in right leg: Secondary | ICD-10-CM

## 2017-09-26 DIAGNOSIS — Z79899 Other long term (current) drug therapy: Secondary | ICD-10-CM | POA: Diagnosis not present

## 2017-09-26 MED ORDER — DICLOFENAC SODIUM 1 % TD GEL: 4.0000 g | Freq: Four times a day (QID) | TRANSDERMAL | 0 refills | Status: DC

## 2017-09-26 NOTE — ED Notes (Signed)
FN: pt brought over from [facility] with right leg pain. VSS per [place]. NAD>

## 2017-09-26 NOTE — ED Triage Notes (Signed)
Pt over from [facility] with c/o pain to right leg. Pt reports pain is right leg from knee. Slight swelling noted to knee. Pt reports sometimes her knee swells and then goes away. Pt reports most pain is behind the knee and states that her MD told her it was arthritis. Pt also with high BP in triage. Pt reports has hx of the same, states that she did not take her meds this morning because she was coming to the MD and didn't eat. Pt also states that her BP goes up everytime she goes to the MD.

## 2017-09-26 NOTE — ED Provider Notes (Signed)
[facility] Emergency Department Provider Note ____________________________________________  Time seen: Approximately 10:19 AM  I have reviewed the triage vital signs and the nursing notes.   HISTORY  Chief Complaint Leg Pain    HPI Toni Paul is a 84 y.o. female who presents to the emergency department for evaluation and treatment of right lower extremity pain. She states that it has bothered her off and on for some time. She states that it swells sometimes, but goes away with rest. Pain is behind the right knee. She denies recent travel, immobilization, or change in medications. She takes an aspirin per day, but is otherwise not on any blood thinners. She has never had a DVT.  Past Medical History:  Diagnosis Date  . Endometrial cancer ([place])   . UTI (urinary tract infection)     Patient Active Problem List   Diagnosis Date Noted  . AV (anaerobic vaginosis) 09/28/2016  . CERVICALGIA 03/21/2010  . RUPTURE ROTATOR CUFF 03/21/2010  . Osteoarthrosis, unspecified whether generalized or localized, shoulder region 09/15/2009  . 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 IMPINGEMENT SYNDROME 09/15/2009    Past Surgical History:  Procedure Laterality Date  . TOTAL ABDOMINAL HYSTERECTOMY W/ BILATERAL SALPINGOOPHORECTOMY      Prior to Admission medications   Medication Sig Start Date End Date Taking? Authorizing Provider  acetaminophen (TYLENOL) 500 MG chewable tablet Chew 500 mg by mouth every 6 (six) hours as needed for pain.    [provider]  aspirin EC 81 MG tablet Take by mouth.    [provider]  calcium carbonate (OS-CAL - DOSED IN MG OF ELEMENTAL CALCIUM) 1250 (500 Ca) MG tablet Take 1 tablet by mouth 2 (two) times daily with a meal.    [provider]  clobetasol ointment (TEMOVATE) 0.05 % Apply topically.    [provider]  dexlansoprazole (DEXILANT) 60 MG capsule Take 60 mg by mouth  daily.    [provider]  diclofenac sodium (VOLTAREN) 1 % GEL Apply 4 g topically 4 (four) times daily. 09/26/17   Toni Trego B, FNP  estradiol (ESTRACE) 0.1 MG/GM vaginal cream Place 1 Applicatorful vaginally at bedtime.    [provider]  losartan (COZAAR) 100 MG tablet Take 100 mg by mouth daily.    [provider]  simvastatin (ZOCOR) 20 MG tablet Take 20 mg by mouth daily.    [provider]  terconazole (TERAZOL 7) 0.4 % vaginal cream Place 1 applicator vaginally at bedtime. 0/62/69   Copland, Alicia B, PA-C  Travoprost, BAK Free, (TRAVATAN) 0.004 % SOLN ophthalmic solution Place 1 drop into both eyes at bedtime.    [provider]    Allergies Pollen extract  No family history on file.  Social History Social History   Tobacco Use  . Smoking status: Never Smoker  . Smokeless tobacco: Never Used  Substance Use Topics  . Alcohol use: No  . Drug use: No    Review of Systems Constitutional: Negative for fever. Cardiovascular: Negative for chest pain. Respiratory: Negative for shortness of breath. Musculoskeletal: Positive for RLE pain. Skin: Positive for intermittent swelling of the right calf and knee.  Neurological: Negative for decrease in sensation  ____________________________________________   PHYSICAL EXAM:  VITAL SIGNS: ED Triage Vitals  Enc Vitals Group     BP 09/26/17 0940 (!) 177/56     Pulse Rate 09/26/17 0940 (!) 55     Resp 09/26/17 0940 20     Temp  09/26/17 0940 97.7 F (36.5 C)     Temp Source 09/26/17 0940 Oral     SpO2 09/26/17 0940 97 %     Weight 09/26/17 0941 135 lb (61.2 kg)     Height 09/26/17 0941 5\' 4"  (1.626 m)     Head Circumference --      Peak Flow --      Pain Score 09/26/17 0940 10     Pain Loc --      Pain Edu? --      Excl. in [place]? --     Constitutional: Alert and oriented. Well appearing and in no acute distress. Eyes: Conjunctivae are clear without discharge or  drainage Head: Atraumatic Neck: Supple Respiratory: No cough. Respirations are even and unlabored. Musculoskeletal: Full, active ROM of the RLE. No swelling noted on exam. Neurologic: Motor and sensation of the RLE is intact  Skin: Area of increased warmth is noted on posterior knee on exam. Cord-like structure is also palpable  Psychiatric: Affect and behavior are appropriate.  ____________________________________________   LABS (all labs ordered are listed, but only abnormal results are displayed)  Labs Reviewed - No data to display ____________________________________________  RADIOLOGY  US of the RLE is negative for DVT per radiology. ____________________________________________   PROCEDURES  Procedures  ____________________________________________   INITIAL IMPRESSION / ASSESSMENT AND PLAN / ED COURSE  Toni Paul is a 84 y.o. who presents to the emergency department for treatment and evaluation of right lower extremity pain. Ultrasound is negative for DVT. She was given a prescription for Voltaren gel and encouraged to wear compression stockings. She is to follow up with the primary care provider for symptoms that do not improve with the stockings or medication.  Patient instructed to follow-up with primary care if not improving over the week.  She was also instructed to return to the emergency department for symptoms that change or worsen if unable schedule an appointment with orthopedics or primary care.  Medications - No data to display  Pertinent labs & imaging results that were available during my care of the patient were reviewed by me and considered in my medical decision making (see chart for details).  _________________________________________   FINAL CLINICAL IMPRESSION(S) / ED DIAGNOSES  Final diagnoses:  Right leg pain    ED Discharge Orders        Ordered    diclofenac sodium (VOLTAREN) 1 % GEL  4 times daily     09/26/17 1216     Compression stockings     09/26/17 1216       If controlled substance prescribed during this visit, 12 month history viewed on the [place] prior to issuing an initial prescription for Schedule II or III opiod.    Victorino Dike, FNP 09/26/17 1403    Carrie Mew, MD 09/26/17 1455

## 2017-09-26 NOTE — ED Notes (Signed)
Pt verbalized understanding of discharge instructions. NAD at this time. 

## 2018-07-15 ENCOUNTER — Encounter: Payer: Self-pay | Admitting: Obstetrics and Gynecology

## 2019-01-03 DIAGNOSIS — M1711 Unilateral primary osteoarthritis, right knee: Secondary | ICD-10-CM | POA: Insufficient documentation

## 2019-02-10 NOTE — Discharge Instructions (Signed)
°  Instructions after Total Knee Replacement ° ° Toni Karpel P. Annalisia Ingber, Jr., M.D.    ° Dept. of Orthopaedics & Sports Medicine ° [facility] ° [address] ° [place], [place]  [address] ° Phone: [phone]   Fax: [phone] ° °  °DIET: °• Drink plenty of non-alcoholic fluids. °• Resume your normal diet. Include foods high in fiber. ° °ACTIVITY:  °• You may use crutches or a walker with weight-bearing as tolerated, unless instructed otherwise. °• You may be weaned off of the walker or crutches by your Physical Therapist.  °• Do NOT place pillows under the knee. Anything placed under the knee could limit your ability to straighten the knee.   °• Continue doing gentle exercises. Exercising will reduce the pain and swelling, increase motion, and prevent muscle weakness.   °• Please continue to use the TED compression stockings for 6 weeks. You may remove the stockings at night, but should reapply them in the morning. °• Do not drive or operate any equipment until instructed. ° °WOUND CARE:  °• Continue to use the PolarCare or ice packs periodically to reduce pain and swelling. °• You may bathe or shower after the staples are removed at the first office visit following surgery. ° °MEDICATIONS: °• You may resume your regular medications. °• Please take the pain medication as prescribed on the medication. °• Do not take pain medication on an empty stomach. °• You have been given a prescription for a blood thinner (Lovenox or Coumadin). Please take the medication as instructed. (NOTE: After completing a 2 week course of Lovenox, take one Enteric-coated aspirin once a day. This along with elevation will help reduce the possibility of phlebitis in your operated leg.) °• Do not drive or drink alcoholic beverages when taking pain medications. ° °CALL THE OFFICE FOR: °• Temperature above 101 degrees °• Excessive bleeding or drainage on the dressing. °• Excessive swelling, coldness, or paleness of the toes. °• Persistent  nausea and vomiting. ° °FOLLOW-UP:  °• You should have an appointment to return to the office in 10-14 days after surgery. °• Arrangements have been made for continuation of Physical Therapy (either home therapy or outpatient therapy). °  °

## 2019-02-12 ENCOUNTER — Encounter
Admission: RE | Admit: 2019-02-12 | Discharge: 2019-02-12 | Disposition: A | Discharge status: Home / Self Care | Payer: Medicare Other | Source: Ambulatory Visit | Attending: Orthopedic Surgery | Admitting: Orthopedic Surgery

## 2019-02-12 ENCOUNTER — Other Ambulatory Visit: Payer: Self-pay

## 2019-02-12 DIAGNOSIS — R9431 Abnormal electrocardiogram [ECG] [EKG]: Secondary | ICD-10-CM | POA: Insufficient documentation

## 2019-02-12 DIAGNOSIS — R001 Bradycardia, unspecified: Secondary | ICD-10-CM | POA: Diagnosis not present

## 2019-02-12 DIAGNOSIS — Z01818 Encounter for other preprocedural examination: Secondary | ICD-10-CM | POA: Insufficient documentation

## 2019-02-12 HISTORY — DX: Essential (primary) hypertension: I10

## 2019-02-12 HISTORY — DX: Gastro-esophageal reflux disease without esophagitis: K21.9

## 2019-02-12 HISTORY — DX: Unspecified osteoarthritis, unspecified site: M19.90

## 2019-02-12 LAB — CBC
HCT: 35.8 % — ABNORMAL LOW (ref 36.0–46.0)
Hemoglobin: 11.4 g/dL — ABNORMAL LOW (ref 12.0–15.0)
MCH: 29.8 pg (ref 26.0–34.0)
MCHC: 31.8 g/dL (ref 30.0–36.0)
MCV: 93.7 fL (ref 80.0–100.0)
Platelets: 249 10*3/uL (ref 150–400)
RBC: 3.82 MIL/uL — ABNORMAL LOW (ref 3.87–5.11)
RDW: 13.8 % (ref 11.5–15.5)
WBC: 5.5 10*3/uL (ref 4.0–10.5)
nRBC: 0 % (ref 0.0–0.2)

## 2019-02-12 LAB — TYPE AND SCREEN
ABO/RH(D): O POS
Antibody Screen: NEGATIVE

## 2019-02-12 LAB — URINALYSIS, ROUTINE W REFLEX MICROSCOPIC
Bilirubin Urine: NEGATIVE
Glucose, UA: NEGATIVE mg/dL
Hgb urine dipstick: NEGATIVE
Ketones, ur: NEGATIVE mg/dL
Leukocytes,Ua: NEGATIVE
Nitrite: NEGATIVE
Protein, ur: NEGATIVE mg/dL
Specific Gravity, Urine: 1.019 (ref 1.005–1.030)
pH: 7 (ref 5.0–8.0)

## 2019-02-12 LAB — C-REACTIVE PROTEIN: CRP: 1.1 mg/dL — ABNORMAL HIGH (ref <1.0)

## 2019-02-12 LAB — SEDIMENTATION RATE: Sed Rate: 15 mm/hr (ref 0–30)

## 2019-02-12 LAB — COMPREHENSIVE METABOLIC PANEL
ALT: 13 U/L (ref 0–44)
AST: 18 U/L (ref 15–41)
Albumin: 4.6 g/dL (ref 3.5–5.0)
Alkaline Phosphatase: 65 U/L (ref 38–126)
Anion gap: 7 (ref 5–15)
BUN: 14 mg/dL (ref 8–23)
CO2: 24 mmol/L (ref 22–32)
Calcium: 9.2 mg/dL (ref 8.9–10.3)
Chloride: 111 mmol/L (ref 98–111)
Creatinine, Ser: 0.6 mg/dL (ref 0.44–1.00)
GFR calc Af Amer: >60 mL/min (ref >60)
GFR calc non Af Amer: >60 mL/min (ref >60)
Glucose, Bld: 92 mg/dL (ref 70–99)
Potassium: 4.1 mmol/L (ref 3.5–5.1)
Sodium: 142 mmol/L (ref 135–145)
Total Bilirubin: 0.5 mg/dL (ref 0.3–1.2)
Total Protein: 6.8 g/dL (ref 6.5–8.1)

## 2019-02-12 LAB — SURGICAL PCR SCREEN
MRSA, PCR: NEGATIVE
Staphylococcus aureus: NEGATIVE

## 2019-02-12 LAB — PROTIME-INR
INR: 1 (ref 0.8–1.2)
Prothrombin Time: 13.5 seconds (ref 11.4–15.2)

## 2019-02-12 LAB — APTT: aPTT: 35 seconds (ref 24–36)

## 2019-02-12 NOTE — Patient Instructions (Signed)
Your procedure is scheduled on: 02-18-19 TUESDAY Report to Same Day Surgery 2nd floor medical mall [facility] Entrance-take elevator on left to 2nd floor.  Check in with surgery information desk.) To find out your arrival time please call [phone] between 1PM - 3PM on 02-17-19 MONDAY  Remember: Instructions that are not followed completely may result in serious medical risk, up to and including death, or upon the discretion of your surgeon and anesthesiologist your surgery may need to be rescheduled.    _x___ 1. Do not eat food after midnight the night before your procedure. NO GUM OR CANDY AFTER MIDNIGHT. You may drink clear liquids up to 2 hours before you are scheduled to arrive at the hospital for your procedure.  Do not drink clear liquids within 2 hours of your scheduled arrival to the hospital.  Clear liquids include  --Water or Apple juice without pulp  --Gatorade  --Black Coffee or Clear Tea (No milk, no creamers, do not add anything to the coffee or Tea   ____Ensure clear carbohydrate drink on the way to the hospital for bariatric patients  _X___Ensure clear carbohydrate drink 3 hours before surgery.     __x__ 2. No Alcohol for 24 hours before or after surgery.   __x__3. No Smoking or e-cigarettes for 24 prior to surgery.  Do not use any chewable tobacco products for at least 6 hour prior to surgery   ____  4. Bring all medications with you on the day of surgery if instructed.    __x__ 5. Notify your doctor if there is any change in your medical condition     (cold, fever, infections).    x___6. On the morning of surgery brush your teeth with toothpaste and water.  You may rinse your mouth with mouth wash if you wish.  Do not swallow any toothpaste or mouthwash.   Do not wear jewelry, make-up, hairpins, clips or nail polish.  Do not wear lotions, powders, or perfumes. You may wear deodorant.  Do not shave 48 hours prior to surgery. Men may shave face and neck.  Do  not bring valuables to the hospital.    [facility] is not responsible for any belongings or valuables.               Contacts, dentures or bridgework may not be worn into surgery.  Leave your suitcase in the car. After surgery it may be brought to your room.  For patients admitted to the hospital, discharge time is determined by your treatment team.  _  Patients discharged the day of surgery will not be allowed to drive home.  You will need someone to drive you home and stay with you the night of your procedure.    Please read over the following fact sheets that you were given:   [facility] Preparing for Surgery and or MRSA Information/[employer]   ____ Take anti-hypertensive listed below, cardiac, seizure, asthma, anti-reflux and psychiatric medicines. These include:  1. NONE  2.  3.  4.  5.  6.  ____Fleets enema or Magnesium Citrate as directed.   _x___ Use CHG Soap or sage wipes as directed on instruction sheet   ____ Use inhalers on the day of surgery and bring to hospital day of surgery  ____ Stop Metformin and Janumet 2 days prior to surgery.    ____ Take 1/2 of usual insulin dose the night before surgery and none on the morning surgery.   _x___ Follow recommendations from  Cardiologist, Pulmonologist or PCP regarding stopping Aspirin, Coumadin, Plavix ,Eliquis, Effient, or Pradaxa, and Pletal-INSTRUCTED BY DR HOOTENS OFFICE TO STOP ASPIRIN (STOPPED ON 02-11-19)  X____Stop Anti-inflammatories such as Advil, Aleve, Ibuprofen, Motrin, Naproxen, Naprosyn, Goodies powders or aspirin products NOW-OK to take Tylenol   ____ Stop supplements until after surgery.     ____ Bring C-Pap to the hospital.

## 2019-02-13 LAB — URINE CULTURE
Culture: NO GROWTH
Special Requests: NORMAL

## 2019-02-14 ENCOUNTER — Other Ambulatory Visit
Admission: RE | Admit: 2019-02-14 | Discharge: 2019-02-14 | Disposition: A | Discharge status: Home / Self Care | Payer: Medicare Other | Source: Ambulatory Visit | Attending: Orthopedic Surgery | Admitting: Orthopedic Surgery

## 2019-02-14 ENCOUNTER — Other Ambulatory Visit: Payer: Self-pay

## 2019-02-14 DIAGNOSIS — Z01818 Encounter for other preprocedural examination: Secondary | ICD-10-CM | POA: Diagnosis not present

## 2019-02-15 LAB — SARS CORONAVIRUS 2 (TAT 6-24 HRS): SARS Coronavirus 2: NEGATIVE

## 2019-02-17 MED ORDER — CEFAZOLIN SODIUM-DEXTROSE 2-4 GM/100ML-% IV SOLN: 2.0000 g | INTRAVENOUS | Status: DC

## 2019-02-17 MED ORDER — TRANEXAMIC ACID-NACL 1000-0.7 MG/100ML-% IV SOLN
1000.0000 mg | INTRAVENOUS | Status: DC
  Filled 2019-02-17: qty 100

## 2019-02-18 ENCOUNTER — Inpatient Hospital Stay: Payer: Medicare Other | Admitting: Anesthesiology

## 2019-02-18 ENCOUNTER — Inpatient Hospital Stay: Payer: Medicare Other

## 2019-02-18 ENCOUNTER — Other Ambulatory Visit: Payer: Self-pay

## 2019-02-18 ENCOUNTER — Encounter: Payer: Self-pay | Admitting: Orthopedic Surgery

## 2019-02-18 ENCOUNTER — Encounter
Admission: RE | Disposition: A | Discharge status: Home Health | Payer: Self-pay | Source: Home / Self Care | Attending: Orthopedic Surgery

## 2019-02-18 ENCOUNTER — Inpatient Hospital Stay
Admission: RE | Admit: 2019-02-18 | Discharge: 2019-02-20 | DRG: 470 | Disposition: A | Discharge status: Home Health | Payer: Medicare Other | Attending: Orthopedic Surgery | Admitting: Orthopedic Surgery

## 2019-02-18 DIAGNOSIS — K219 Gastro-esophageal reflux disease without esophagitis: Secondary | ICD-10-CM | POA: Diagnosis present

## 2019-02-18 DIAGNOSIS — Z96652 Presence of left artificial knee joint: Secondary | ICD-10-CM | POA: Diagnosis present

## 2019-02-18 DIAGNOSIS — Z888 Allergy status to other drugs, medicaments and biological substances status: Secondary | ICD-10-CM

## 2019-02-18 DIAGNOSIS — M1711 Unilateral primary osteoarthritis, right knee: Secondary | ICD-10-CM | POA: Diagnosis present

## 2019-02-18 DIAGNOSIS — Z96659 Presence of unspecified artificial knee joint: Secondary | ICD-10-CM

## 2019-02-18 DIAGNOSIS — Z791 Long term (current) use of non-steroidal anti-inflammatories (NSAID): Secondary | ICD-10-CM

## 2019-02-18 DIAGNOSIS — Z8711 Personal history of peptic ulcer disease: Secondary | ICD-10-CM

## 2019-02-18 DIAGNOSIS — Z79899 Other long term (current) drug therapy: Secondary | ICD-10-CM

## 2019-02-18 DIAGNOSIS — I10 Essential (primary) hypertension: Secondary | ICD-10-CM | POA: Diagnosis present

## 2019-02-18 DIAGNOSIS — Z8542 Personal history of malignant neoplasm of other parts of uterus: Secondary | ICD-10-CM | POA: Diagnosis not present

## 2019-02-18 HISTORY — PX: KNEE ARTHROPLASTY: SHX992

## 2019-02-18 LAB — ABO/RH: ABO/RH(D): O POS

## 2019-02-18 SURGERY — ARTHROPLASTY, KNEE, TOTAL, USING IMAGELESS COMPUTER-ASSISTED NAVIGATION
Anesthesia: Spinal | Laterality: Right

## 2019-02-18 MED ORDER — CELECOXIB 200 MG PO CAPS
200.0000 mg | ORAL_CAPSULE | Freq: Two times a day (BID) | ORAL | Status: DC
  Administered 2019-02-18 – 2019-02-20 (×4): 200 mg via ORAL
  Filled 2019-02-18 (×4): qty 1

## 2019-02-18 MED ORDER — ACETAMINOPHEN 325 MG PO TABS
325.0000 mg | ORAL_TABLET | Freq: Four times a day (QID) | ORAL | Status: DC | PRN
  Administered 2019-02-19: 650 mg via ORAL
  Filled 2019-02-18: qty 2

## 2019-02-18 MED ORDER — ONDANSETRON HCL 4 MG PO TABS: 4.0000 mg | ORAL_TABLET | Freq: Four times a day (QID) | ORAL | Status: DC | PRN

## 2019-02-18 MED ORDER — GABAPENTIN 300 MG PO CAPS
ORAL_CAPSULE | ORAL | Status: AC
  Administered 2019-02-18: 300 mg via ORAL
  Filled 2019-02-18: qty 1

## 2019-02-18 MED ORDER — LOSARTAN POTASSIUM 50 MG PO TABS
100.0000 mg | ORAL_TABLET | ORAL | Status: DC
  Administered 2019-02-19 – 2019-02-20 (×2): 100 mg via ORAL
  Filled 2019-02-18 (×2): qty 2

## 2019-02-18 MED ORDER — GABAPENTIN 300 MG PO CAPS
300.0000 mg | ORAL_CAPSULE | Freq: Every day | ORAL | Status: DC
  Administered 2019-02-18 – 2019-02-19 (×2): 300 mg via ORAL
  Filled 2019-02-18 (×2): qty 1

## 2019-02-18 MED ORDER — ALUM & MAG HYDROXIDE-SIMETH 200-200-20 MG/5ML PO SUSP: 30.0000 mL | ORAL | Status: DC | PRN

## 2019-02-18 MED ORDER — SODIUM CHLORIDE FLUSH 0.9 % IV SOLN
INTRAVENOUS | Status: AC
  Filled 2019-02-18: qty 40

## 2019-02-18 MED ORDER — ACETAMINOPHEN 10 MG/ML IV SOLN
1000.0000 mg | Freq: Four times a day (QID) | INTRAVENOUS | Status: AC
  Administered 2019-02-18 – 2019-02-19 (×2): 1000 mg via INTRAVENOUS
  Filled 2019-02-18 (×2): qty 100

## 2019-02-18 MED ORDER — ONDANSETRON HCL 4 MG/2ML IJ SOLN: 4.0000 mg | Freq: Four times a day (QID) | INTRAMUSCULAR | Status: DC | PRN

## 2019-02-18 MED ORDER — DEXAMETHASONE SODIUM PHOSPHATE 10 MG/ML IJ SOLN
INTRAMUSCULAR | Status: AC
  Administered 2019-02-18: 8 mg via INTRAVENOUS
  Filled 2019-02-18: qty 1

## 2019-02-18 MED ORDER — CLOBETASOL PROPIONATE 0.05 % EX OINT
1.0000 "application " | TOPICAL_OINTMENT | Freq: Two times a day (BID) | CUTANEOUS | Status: DC | PRN
  Filled 2019-02-18: qty 15

## 2019-02-18 MED ORDER — DIPHENHYDRAMINE HCL 12.5 MG/5ML PO ELIX: 12.5000 mg | ORAL_SOLUTION | ORAL | Status: DC | PRN

## 2019-02-18 MED ORDER — CEFAZOLIN SODIUM-DEXTROSE 2-4 GM/100ML-% IV SOLN
2.0000 g | Freq: Four times a day (QID) | INTRAVENOUS | Status: AC
  Administered 2019-02-18 – 2019-02-19 (×3): 2 g via INTRAVENOUS
  Filled 2019-02-18 (×5): qty 100

## 2019-02-18 MED ORDER — SODIUM CHLORIDE 0.9 % IV SOLN
INTRAVENOUS | Status: DC | PRN
  Administered 2019-02-18: 50 ug/min via INTRAVENOUS

## 2019-02-18 MED ORDER — TRANEXAMIC ACID-NACL 1000-0.7 MG/100ML-% IV SOLN
INTRAVENOUS | Status: DC | PRN
  Administered 2019-02-18: 1000 mg via INTRAVENOUS

## 2019-02-18 MED ORDER — LATANOPROST 0.005 % OP SOLN
1.0000 [drp] | Freq: Every day | OPHTHALMIC | Status: DC
  Administered 2019-02-18: 1 [drp] via OPHTHALMIC
  Filled 2019-02-18: qty 2.5

## 2019-02-18 MED ORDER — TRANEXAMIC ACID-NACL 1000-0.7 MG/100ML-% IV SOLN
1000.0000 mg | Freq: Once | INTRAVENOUS | Status: AC
  Administered 2019-02-18: 1000 mg via INTRAVENOUS
  Filled 2019-02-18: qty 100

## 2019-02-18 MED ORDER — METOCLOPRAMIDE HCL 10 MG PO TABS: 5.0000 mg | ORAL_TABLET | Freq: Three times a day (TID) | ORAL | Status: DC | PRN

## 2019-02-18 MED ORDER — ENOXAPARIN SODIUM 30 MG/0.3ML ~~LOC~~ SOLN
30.0000 mg | Freq: Two times a day (BID) | SUBCUTANEOUS | Status: DC
  Administered 2019-02-19 – 2019-02-20 (×3): 30 mg via SUBCUTANEOUS
  Filled 2019-02-18 (×3): qty 0.3

## 2019-02-18 MED ORDER — BUPIVACAINE LIPOSOME 1.3 % IJ SUSP
INTRAMUSCULAR | Status: AC
  Filled 2019-02-18: qty 20

## 2019-02-18 MED ORDER — MAGNESIUM HYDROXIDE 400 MG/5ML PO SUSP
30.0000 mL | Freq: Every day | ORAL | Status: DC
  Administered 2019-02-19 – 2019-02-20 (×2): 30 mL via ORAL
  Filled 2019-02-18 (×2): qty 30

## 2019-02-18 MED ORDER — PROPOFOL 500 MG/50ML IV EMUL
INTRAVENOUS | Status: AC
  Filled 2019-02-18: qty 50

## 2019-02-18 MED ORDER — METOCLOPRAMIDE HCL 5 MG/ML IJ SOLN: 5.0000 mg | Freq: Three times a day (TID) | INTRAMUSCULAR | Status: DC | PRN

## 2019-02-18 MED ORDER — ONDANSETRON HCL 4 MG/2ML IJ SOLN
INTRAMUSCULAR | Status: DC | PRN
  Administered 2019-02-18: 4 mg via INTRAVENOUS

## 2019-02-18 MED ORDER — MENTHOL 3 MG MT LOZG
1.0000 | LOZENGE | OROMUCOSAL | Status: DC | PRN
  Filled 2019-02-18: qty 9

## 2019-02-18 MED ORDER — TRAMADOL HCL 50 MG PO TABS: 50.0000 mg | ORAL_TABLET | ORAL | Status: DC | PRN

## 2019-02-18 MED ORDER — PHENOL 1.4 % MT LIQD
1.0000 | OROMUCOSAL | Status: DC | PRN
  Filled 2019-02-18: qty 177

## 2019-02-18 MED ORDER — PHENYLEPHRINE HCL (PRESSORS) 10 MG/ML IV SOLN
INTRAVENOUS | Status: AC
  Filled 2019-02-18: qty 1

## 2019-02-18 MED ORDER — BUPIVACAINE HCL (PF) 0.5 % IJ SOLN
INTRAMUSCULAR | Status: DC | PRN
  Administered 2019-02-18: 3 mL

## 2019-02-18 MED ORDER — SODIUM CHLORIDE 0.9 % IV SOLN
INTRAVENOUS | Status: DC
  Administered 2019-02-18: 20:00:00 via INTRAVENOUS

## 2019-02-18 MED ORDER — NEOMYCIN-POLYMYXIN B GU 40-200000 IR SOLN
Status: AC
  Filled 2019-02-18: qty 20

## 2019-02-18 MED ORDER — DEXAMETHASONE SODIUM PHOSPHATE 10 MG/ML IJ SOLN
8.0000 mg | Freq: Once | INTRAMUSCULAR | Status: AC
  Administered 2019-02-18: 8 mg via INTRAVENOUS

## 2019-02-18 MED ORDER — CELECOXIB 200 MG PO CAPS
400.0000 mg | ORAL_CAPSULE | Freq: Once | ORAL | Status: AC
  Administered 2019-02-18: 400 mg via ORAL

## 2019-02-18 MED ORDER — ACETAMINOPHEN 10 MG/ML IV SOLN
INTRAVENOUS | Status: DC | PRN
  Administered 2019-02-18: 1000 mg via INTRAVENOUS

## 2019-02-18 MED ORDER — FAMOTIDINE 20 MG PO TABS
ORAL_TABLET | ORAL | Status: AC
  Administered 2019-02-18: 20 mg via ORAL
  Filled 2019-02-18: qty 1

## 2019-02-18 MED ORDER — CEFAZOLIN SODIUM-DEXTROSE 1-4 GM/50ML-% IV SOLN
INTRAVENOUS | Status: DC | PRN
  Administered 2019-02-18: 1 g via INTRAVENOUS

## 2019-02-18 MED ORDER — FAMOTIDINE 20 MG PO TABS
20.0000 mg | ORAL_TABLET | Freq: Once | ORAL | Status: AC
  Administered 2019-02-18: 20 mg via ORAL

## 2019-02-18 MED ORDER — ACETAMINOPHEN 10 MG/ML IV SOLN
INTRAVENOUS | Status: AC
  Filled 2019-02-18: qty 100

## 2019-02-18 MED ORDER — OXYCODONE HCL 5 MG PO TABS: 10.0000 mg | ORAL_TABLET | ORAL | Status: DC | PRN

## 2019-02-18 MED ORDER — PROPOFOL 10 MG/ML IV BOLUS
INTRAVENOUS | Status: DC | PRN
  Administered 2019-02-18: 20 mg via INTRAVENOUS

## 2019-02-18 MED ORDER — FLEET ENEMA 7-19 GM/118ML RE ENEM
1.0000 | ENEMA | Freq: Once | RECTAL | Status: AC | PRN
  Administered 2019-02-20: 1 via RECTAL

## 2019-02-18 MED ORDER — OXYCODONE HCL 5 MG PO TABS: 5.0000 mg | ORAL_TABLET | ORAL | Status: DC | PRN

## 2019-02-18 MED ORDER — CALCIUM CARBONATE ANTACID 500 MG PO CHEW
500.0000 mg | CHEWABLE_TABLET | Freq: Two times a day (BID) | ORAL | Status: DC
  Administered 2019-02-19 (×2): 500 mg via ORAL
  Filled 2019-02-18 (×3): qty 3

## 2019-02-18 MED ORDER — ONDANSETRON HCL 4 MG/2ML IJ SOLN: 4.0000 mg | Freq: Once | INTRAMUSCULAR | Status: DC | PRN

## 2019-02-18 MED ORDER — LACTATED RINGERS IV SOLN
INTRAVENOUS | Status: DC
  Administered 2019-02-18: 50 mL/h via INTRAVENOUS
  Administered 2019-02-18: 13:00:00 via INTRAVENOUS

## 2019-02-18 MED ORDER — LIDOCAINE HCL (PF) 2 % IJ SOLN
INTRAMUSCULAR | Status: AC
  Filled 2019-02-18: qty 10

## 2019-02-18 MED ORDER — FENTANYL CITRATE (PF) 100 MCG/2ML IJ SOLN: 25.0000 ug | INTRAMUSCULAR | Status: DC | PRN

## 2019-02-18 MED ORDER — CHLORHEXIDINE GLUCONATE 4 % EX LIQD: 60.0000 mL | Freq: Once | CUTANEOUS | Status: DC

## 2019-02-18 MED ORDER — BUPIVACAINE HCL (PF) 0.25 % IJ SOLN
INTRAMUSCULAR | Status: AC
  Filled 2019-02-18: qty 60

## 2019-02-18 MED ORDER — CEFAZOLIN SODIUM-DEXTROSE 2-4 GM/100ML-% IV SOLN
INTRAVENOUS | Status: AC
  Filled 2019-02-18: qty 100

## 2019-02-18 MED ORDER — METOCLOPRAMIDE HCL 10 MG PO TABS
10.0000 mg | ORAL_TABLET | Freq: Three times a day (TID) | ORAL | Status: DC
  Administered 2019-02-18 – 2019-02-20 (×5): 10 mg via ORAL
  Filled 2019-02-18 (×5): qty 1

## 2019-02-18 MED ORDER — FERROUS SULFATE 325 (65 FE) MG PO TABS
325.0000 mg | ORAL_TABLET | Freq: Two times a day (BID) | ORAL | Status: DC
  Administered 2019-02-19 – 2019-02-20 (×3): 325 mg via ORAL
  Filled 2019-02-18 (×3): qty 1

## 2019-02-18 MED ORDER — GABAPENTIN 300 MG PO CAPS
300.0000 mg | ORAL_CAPSULE | Freq: Once | ORAL | Status: AC
  Administered 2019-02-18: 300 mg via ORAL

## 2019-02-18 MED ORDER — PANTOPRAZOLE SODIUM 40 MG PO TBEC
40.0000 mg | DELAYED_RELEASE_TABLET | Freq: Two times a day (BID) | ORAL | Status: DC
  Administered 2019-02-18 – 2019-02-20 (×4): 40 mg via ORAL
  Filled 2019-02-18 (×4): qty 1

## 2019-02-18 MED ORDER — NEOMYCIN-POLYMYXIN B GU 40-200000 IR SOLN
Status: DC | PRN
  Administered 2019-02-18: 14 mL

## 2019-02-18 MED ORDER — PROPOFOL 500 MG/50ML IV EMUL
INTRAVENOUS | Status: DC | PRN
  Administered 2019-02-18: 30 ug/kg/min via INTRAVENOUS

## 2019-02-18 MED ORDER — BISACODYL 10 MG RE SUPP
10.0000 mg | Freq: Every day | RECTAL | Status: DC | PRN
  Administered 2019-02-20: 10 mg via RECTAL
  Filled 2019-02-18: qty 1

## 2019-02-18 MED ORDER — ENSURE PRE-SURGERY PO LIQD
296.0000 mL | Freq: Once | ORAL | Status: DC
  Filled 2019-02-18: qty 296

## 2019-02-18 MED ORDER — HYDROMORPHONE HCL 1 MG/ML IJ SOLN: 0.5000 mg | INTRAMUSCULAR | Status: DC | PRN

## 2019-02-18 MED ORDER — DORZOLAMIDE HCL-TIMOLOL MAL 2-0.5 % OP SOLN
1.0000 [drp] | Freq: Two times a day (BID) | OPHTHALMIC | Status: DC
  Administered 2019-02-18 – 2019-02-20 (×4): 1 [drp] via OPHTHALMIC
  Filled 2019-02-18: qty 10

## 2019-02-18 MED ORDER — BUPIVACAINE HCL (PF) 0.25 % IJ SOLN
INTRAMUSCULAR | Status: DC | PRN
  Administered 2019-02-18: 60 mL

## 2019-02-18 MED ORDER — CELECOXIB 200 MG PO CAPS
ORAL_CAPSULE | ORAL | Status: AC
  Administered 2019-02-18: 400 mg via ORAL
  Filled 2019-02-18: qty 2

## 2019-02-18 MED ORDER — SODIUM CHLORIDE 0.9 % IV SOLN
INTRAVENOUS | Status: DC | PRN
  Administered 2019-02-18: 14:00:00 60 mL

## 2019-02-18 MED ORDER — SENNOSIDES-DOCUSATE SODIUM 8.6-50 MG PO TABS
1.0000 | ORAL_TABLET | Freq: Two times a day (BID) | ORAL | Status: DC
  Administered 2019-02-18 – 2019-02-20 (×4): 1 via ORAL
  Filled 2019-02-18 (×4): qty 1

## 2019-02-18 SURGICAL SUPPLY — 67 items
ATTUNE MED DOME PAT 38 KNEE (Knees) ×1 IMPLANT
ATTUNE PS FEM RT SZ 5 CEM KNEE (Femur) ×1 IMPLANT
ATTUNE PSRP INSR SZ5 5 KNEE (Insert) ×1 IMPLANT
BASEPLATE TIBIAL ROTATING SZ 4 (Knees) ×1 IMPLANT
BATTERY INSTRU NAVIGATION (MISCELLANEOUS) ×8 IMPLANT
BLADE SAW 70X12.5 (BLADE) ×2 IMPLANT
BLADE SAW 90X13X1.19 OSCILLAT (BLADE) ×2 IMPLANT
BLADE SAW 90X25X1.19 OSCILLAT (BLADE) ×2 IMPLANT
BONE CEMENT GENTAMICIN (Cement) ×4 IMPLANT
CANISTER SUCT 3000ML PPV (MISCELLANEOUS) ×2 IMPLANT
CEMENT BONE GENTAMICIN 40 (Cement) IMPLANT
COOLER POLAR GLACIER W/PUMP (MISCELLANEOUS) ×2 IMPLANT
COVER WAND RF STERILE (DRAPES) ×2 IMPLANT
CUFF TOURN SGL QUICK 24 (TOURNIQUET CUFF)
CUFF TOURN SGL QUICK 30 (TOURNIQUET CUFF)
CUFF TRNQT CYL 24X4X16.5-23 (TOURNIQUET CUFF) IMPLANT
CUFF TRNQT CYL 30X4X21-28X (TOURNIQUET CUFF) IMPLANT
DRAPE 3/4 80X56 (DRAPES) ×2 IMPLANT
DRSG DERMACEA 8X12 NADH (GAUZE/BANDAGES/DRESSINGS) ×2 IMPLANT
DRSG OPSITE POSTOP 4X14 (GAUZE/BANDAGES/DRESSINGS) ×2 IMPLANT
DRSG TEGADERM 4X4.75 (GAUZE/BANDAGES/DRESSINGS) ×2 IMPLANT
DURAPREP 26ML APPLICATOR (WOUND CARE) ×4 IMPLANT
ELECT REM PT RETURN 9FT ADLT (ELECTROSURGICAL) ×2
ELECTRODE REM PT RTRN 9FT ADLT (ELECTROSURGICAL) ×1 IMPLANT
EX-PIN ORTHOLOCK NAV 4X150 (PIN) ×4 IMPLANT
GLOVE BIOGEL M STRL SZ7.5 (GLOVE) ×4 IMPLANT
GLOVE INDICATOR 8.0 STRL GRN (GLOVE) ×2 IMPLANT
GOWN STRL REUS W/ TWL LRG LVL3 (GOWN DISPOSABLE) ×2 IMPLANT
GOWN STRL REUS W/TWL LRG LVL3 (GOWN DISPOSABLE) ×2
HEMOVAC 400CC 10FR (MISCELLANEOUS) ×2 IMPLANT
HOLDER FOLEY CATH W/STRAP (MISCELLANEOUS) ×2 IMPLANT
HOOD PEEL AWAY FLYTE STAYCOOL (MISCELLANEOUS) ×4 IMPLANT
KIT TURNOVER KIT A (KITS) ×2 IMPLANT
KNIFE SCULPS 14X20 (INSTRUMENTS) ×2 IMPLANT
LABEL OR SOLS (LABEL) ×2 IMPLANT
MANIFOLD NEPTUNE II (INSTRUMENTS) ×2 IMPLANT
NDL SAFETY ECLIPSE 18X1.5 (NEEDLE) ×1 IMPLANT
NDL SPNL 20GX3.5 QUINCKE YW (NEEDLE) ×2 IMPLANT
NEEDLE HYPO 18GX1.5 SHARP (NEEDLE) ×1
NEEDLE SPNL 20GX3.5 QUINCKE YW (NEEDLE) ×4 IMPLANT
NS IRRIG 500ML POUR BTL (IV SOLUTION) ×2 IMPLANT
PACK TOTAL KNEE (MISCELLANEOUS) ×2 IMPLANT
PAD WRAPON POLAR KNEE (MISCELLANEOUS) ×1 IMPLANT
PENCIL SMOKE ULTRAEVAC 22 CON (MISCELLANEOUS) ×2 IMPLANT
PIN DRILL QUICK PACK ×2 IMPLANT
PIN FIXATION 1/8DIA X 3INL (PIN) ×6 IMPLANT
PULSAVAC PLUS IRRIG FAN TIP (DISPOSABLE) ×2
SOL .9 NS 3000ML IRR  AL (IV SOLUTION) ×1
SOL .9 NS 3000ML IRR UROMATIC (IV SOLUTION) ×1 IMPLANT
SOL PREP PVP 2OZ (MISCELLANEOUS) ×2
SOLUTION PREP PVP 2OZ (MISCELLANEOUS) ×1 IMPLANT
SPONGE DRAIN TRACH 4X4 STRL 2S (GAUZE/BANDAGES/DRESSINGS) ×2 IMPLANT
STAPLER SKIN PROX 35W (STAPLE) ×2 IMPLANT
STOCKINETTE IMPERV 14X48 (MISCELLANEOUS) IMPLANT
STRAP TIBIA SHORT (MISCELLANEOUS) ×2 IMPLANT
SUCTION FRAZIER HANDLE 10FR (MISCELLANEOUS) ×1
SUCTION TUBE FRAZIER 10FR DISP (MISCELLANEOUS) ×1 IMPLANT
SUT VIC AB 0 CT1 36 (SUTURE) ×2 IMPLANT
SUT VIC AB 1 CT1 36 (SUTURE) ×4 IMPLANT
SUT VIC AB 2-0 CT2 27 (SUTURE) ×2 IMPLANT
SYR 20ML LL LF (SYRINGE) ×2 IMPLANT
SYR 30ML LL (SYRINGE) ×4 IMPLANT
TIP FAN IRRIG PULSAVAC PLUS (DISPOSABLE) ×1 IMPLANT
TOWEL OR 17X26 4PK STRL BLUE (TOWEL DISPOSABLE) ×2 IMPLANT
TOWER CARTRIDGE SMART MIX (DISPOSABLE) ×2 IMPLANT
TRAY FOLEY MTR SLVR 16FR STAT (SET/KITS/TRAYS/PACK) ×2 IMPLANT
WRAPON POLAR PAD KNEE (MISCELLANEOUS) ×2

## 2019-02-18 NOTE — H&P (Signed)
The patient has been re-examined, and the chart reviewed, and there have been no interval changes to the documented history and physical.    The risks, benefits, and alternatives have been discussed at length. The patient expressed understanding of the risks benefits and agreed with plans for surgical intervention.  James P. Hooten, Jr. M.D.    

## 2019-02-18 NOTE — Anesthesia Preprocedure Evaluation (Signed)
Anesthesia Evaluation  Patient identified by MRN, date of birth, ID band Patient awake    Reviewed: Allergy & Precautions, NPO status , Patient's Chart, lab work & pertinent test results  History of Anesthesia Complications Negative for: history of anesthetic complications  Airway Mallampati: II  TM Distance: >3 FB Neck ROM: Full    Dental  (+) Edentulous Upper, Edentulous Lower   Pulmonary neg pulmonary ROS, neg sleep apnea, neg COPD,    breath sounds clear to auscultation- rhonchi (-) wheezing      Cardiovascular hypertension, (-) CAD, (-) Past MI, (-) Cardiac Stents and (-) CABG  Rhythm:Regular Rate:Normal - Systolic murmurs and - Diastolic murmurs    Neuro/Psych neg Seizures negative neurological ROS  negative psych ROS   GI/Hepatic Neg liver ROS, GERD  ,  Endo/Other  negative endocrine ROSneg diabetes  Renal/GU negative Renal ROS     Musculoskeletal  (+) Arthritis ,   Abdominal (+) - obese,   Peds  Hematology negative hematology ROS (+)   Anesthesia Other Findings Past Medical History: No date: Arthritis No date: Endometrial cancer (HCC) No date: GERD (gastroesophageal reflux disease)     Comment:  h/o No date: Hypertension No date: UTI (urinary tract infection)   Reproductive/Obstetrics                             Lab Results  Component Value Date   WBC 5.5 02/12/2019   HGB 11.4 (L) 02/12/2019   HCT 35.8 (L) 02/12/2019   MCV 93.7 02/12/2019   PLT 249 02/12/2019    Anesthesia Physical Anesthesia Plan  ASA: II  Anesthesia Plan: Spinal   Post-op Pain Management:    Induction:   PONV Risk Score and Plan: 2 and Propofol infusion  Airway Management Planned: Natural Airway  Additional Equipment:   Intra-op Plan:   Post-operative Plan:   Informed Consent: I have reviewed the patients History and Physical, chart, labs and discussed the procedure including the  risks, benefits and alternatives for the proposed anesthesia with the patient or authorized representative who has indicated his/her understanding and acceptance.     Dental advisory given  Plan Discussed with: CRNA and Anesthesiologist  Anesthesia Plan Comments:         Anesthesia Quick Evaluation

## 2019-02-18 NOTE — Transfer of Care (Signed)
Immediate Anesthesia Transfer of Care Note  Patient: Toni Paul  Procedure(s) Performed: COMPUTER ASSISTED TOTAL KNEE ARTHROPLASTY - RNFA (Right )  Patient Location: PACU  Anesthesia Type:Spinal  Level of Consciousness: awake, alert  and oriented  Airway & Oxygen Therapy: Patient Spontanous Breathing and Patient connected to face mask oxygen  Post-op Assessment: Report given to RN and Post -op Vital signs reviewed and stable  Post vital signs: Reviewed and stable  Last Vitals:  Vitals Value Taken Time  BP 119/63 02/18/19 1537  Temp 36.2 C 02/18/19 1537  Pulse 53 02/18/19 1541  Resp 18 02/18/19 1541  SpO2 100 % 02/18/19 1541  Vitals shown include unvalidated device data.  Last Pain:  Vitals:   02/18/19 1537  TempSrc:   PainSc: 0-No pain      Patients Stated Pain Goal: 1 (48/30/15 9968)  Complications: No apparent anesthesia complications

## 2019-02-18 NOTE — Anesthesia Post-op Follow-up Note (Signed)
Anesthesia QCDR form completed.        

## 2019-02-18 NOTE — Op Note (Signed)
OPERATIVE NOTE  DATE OF SURGERY:  02/18/2019  PATIENT NAME:  Toni Paul   DOB: Nov 13, 1922  MRN: [id number]  PRE-OPERATIVE DIAGNOSIS: Degenerative arthrosis of the right knee, primary  POST-OPERATIVE DIAGNOSIS:  Same  PROCEDURE:  Right total knee arthroplasty using computer-assisted navigation  SURGEON:  Marciano Sequin. M.D.  ASSISTANT:  Maximino Greenland, RN (present and scrubbed throughout the case, critical for assistance with exposure, retraction, instrumentation, and closure)  ANESTHESIA: spinal  ESTIMATED BLOOD LOSS: 50 mL  FLUIDS REPLACED: 1150 mL of crystalloid  TOURNIQUET TIME: 98 minutes  DRAINS: 2 medium Hemovac drains  SOFT TISSUE RELEASES: Anterior cruciate ligament, posterior cruciate ligament, deep medial collateral ligament, patellofemoral ligament  IMPLANTS UTILIZED: DePuy Attune size 5 posterior stabilized femoral component (cemented), size 4 rotating platform tibial component (cemented), 38 mm medialized dome patella (cemented), and a 5 mm stabilized rotating platform polyethylene insert.  INDICATIONS FOR SURGERY: Toni Paul is a 85 y.o. year old female with a long history of progressive knee pain. X-rays demonstrated severe degenerative changes in tricompartmental fashion. The patient had not seen any significant improvement despite conservative nonsurgical intervention. After discussion of the risks and benefits of surgical intervention, the patient expressed understanding of the risks benefits and agree with plans for total knee arthroplasty.   The risks, benefits, and alternatives were discussed at length including but not limited to the risks of infection, bleeding, nerve injury, stiffness, blood clots, the need for revision surgery, cardiopulmonary complications, among others, and they were willing to proceed.  PROCEDURE IN DETAIL: The patient was brought into the operating room and, after adequate spinal anesthesia was achieved, a  tourniquet was placed on the patient's upper thigh. The patient's knee and leg were cleaned and prepped with alcohol and DuraPrep and draped in the usual sterile fashion. A "timeout" was performed as per usual protocol. The lower extremity was exsanguinated using an Esmarch, and the tourniquet was inflated to 300 mmHg. An anterior longitudinal incision was made followed by a standard mid vastus approach. The deep fibers of the medial collateral ligament were elevated in a subperiosteal fashion off of the medial flare of the tibia so as to maintain a continuous soft tissue sleeve. The patella was subluxed laterally and the patellofemoral ligament was incised. Inspection of the knee demonstrated severe degenerative changes with full-thickness loss of articular cartilage. Osteophytes were debrided using a rongeur. Anterior and posterior cruciate ligaments were excised. Two 4.0 mm Schanz pins were inserted in the femur and into the tibia for attachment of the array of trackers used for computer-assisted navigation. Hip center was identified using a circumduction technique. Distal landmarks were mapped using the computer. The distal femur and proximal tibia were mapped using the computer. The distal femoral cutting guide was positioned using computer-assisted navigation so as to achieve a 5 distal valgus cut. The femur was sized and it was felt that a size 5 femoral component was appropriate. A size 5 femoral cutting guide was positioned and the anterior cut was performed and verified using the computer. This was followed by completion of the posterior and chamfer cuts. Femoral cutting guide for the central box was then positioned in the center box cut was performed.  Attention was then directed to the proximal tibia. Medial and lateral menisci were excised. The extramedullary tibial cutting guide was positioned using computer-assisted navigation so as to achieve a 0 varus-valgus alignment and 3 posterior slope. The  cut was performed and verified using the computer. The proximal tibia  was sized and it was felt that a size 4 tibial tray was appropriate. Tibial and femoral trials were inserted followed by insertion of a 5 mm polyethylene insert. This allowed for excellent mediolateral soft tissue balancing both in flexion and in full extension. Finally, the patella was cut and prepared so as to accommodate a 38 mm medialized dome patella. A patella trial was placed and the knee was placed through a range of motion with excellent patellar tracking appreciated. The femoral trial was removed after debridement of posterior osteophytes. The central post-hole for the tibial component was reamed followed by insertion of a keel punch. Tibial trials were then removed. Cut surfaces of bone were irrigated with copious amounts of normal saline with antibiotic solution using pulsatile lavage and then suctioned dry. Polymethylmethacrylate cement with gentamicin was prepared in the usual fashion using a vacuum mixer. Cement was applied to the cut surface of the proximal tibia as well as along the undersurface of a size 4 rotating platform tibial component. Tibial component was positioned and impacted into place. Excess cement was removed using [employer] fast streamer. Cement was then applied to the cut surfaces of the femur as well as along the posterior flanges of the size 5 femoral component. The femoral component was positioned and impacted into place. Excess cement was removed using [employer] fast streamer. A 5 mm polyethylene trial was inserted and the knee was brought into full extension with steady axial compression applied. Finally, cement was applied to the backside of a 38 mm medialized dome patella and the patellar component was positioned and patellar clamp applied. Excess cement was removed using [employer] fast streamer. After adequate curing of the cement, the tourniquet was deflated after a total tourniquet time of 98 minutes. Hemostasis was achieved  using electrocautery. The knee was irrigated with copious amounts of normal saline with antibiotic solution using pulsatile lavage and then suctioned dry. 20 mL of 1.3% Exparel and 60 mL of 0.25% Marcaine in 40 mL of normal saline was injected along the posterior capsule, medial and lateral gutters, and along the arthrotomy site. A 5 mm stabilized rotating platform polyethylene insert was inserted and the knee was placed through a range of motion with excellent mediolateral soft tissue balancing appreciated and excellent patellar tracking noted. 2 medium drains were placed in the wound bed and brought out through separate stab incisions. The medial parapatellar portion of the incision was reapproximated using interrupted sutures of #1 Vicryl. Subcutaneous tissue was approximated in layers using first #0 Vicryl followed #2-0 Vicryl. The skin was approximated with skin staples. A sterile dressing was applied.  The patient tolerated the procedure well and was transported to the recovery room in stable condition.    James P. Holley Bouche., M.D.

## 2019-02-18 NOTE — Anesthesia Procedure Notes (Signed)
Spinal  Patient location during procedure: OR Start time: 02/18/2019 11:47 AM End time: 02/18/2019 11:53 AM Staffing Anesthesiologist: Emmie Niemann, MD Resident/CRNA: Caryl Asp, CRNA Performed: resident/CRNA  Preanesthetic Checklist Completed: patient identified, site marked, surgical consent, pre-op evaluation, timeout performed, IV checked, risks and benefits discussed and monitors and equipment checked Spinal Block Patient position: sitting Prep: ChloraPrep Patient monitoring: heart rate, continuous pulse ox and blood pressure Approach: midline Location: L4-5 Injection technique: single-shot Needle Needle type: Introducer and Pencil-Tip  Needle gauge: 24 G Needle length: 9 cm Additional Notes Negative paresthesia. Negative blood return. Positive free-flowing CSF. Expiration date of kit checked and confirmed. Patient tolerated procedure well, without complications.

## 2019-02-19 MED ORDER — TRAMADOL HCL 50 MG PO TABS: 50.0000 mg | ORAL_TABLET | Freq: Four times a day (QID) | ORAL | 0 refills | Status: AC | PRN

## 2019-02-19 MED ORDER — OXYCODONE HCL 5 MG PO TABS: 5.0000 mg | ORAL_TABLET | ORAL | 0 refills | Status: AC | PRN

## 2019-02-19 MED ORDER — ENOXAPARIN SODIUM 40 MG/0.4ML ~~LOC~~ SOLN: 40.0000 mg | SUBCUTANEOUS | 0 refills | Status: AC

## 2019-02-19 NOTE — Anesthesia Postprocedure Evaluation (Signed)
Anesthesia Post Note  Patient: Bea Duren  Procedure(s) Performed: COMPUTER ASSISTED TOTAL KNEE ARTHROPLASTY - RNFA (Right )  Patient location during evaluation: Nursing Unit Anesthesia Type: Spinal Level of consciousness: awake, awake and alert, oriented and patient cooperative Pain management: pain level controlled Vital Signs Assessment: post-procedure vital signs reviewed and stable Respiratory status: spontaneous breathing and nonlabored ventilation Cardiovascular status: stable Postop Assessment: no headache, no backache, patient able to bend at knees, no apparent nausea or vomiting and adequate PO intake Anesthetic complications: no     Last Vitals:  Vitals:   02/18/19 2335 02/19/19 0723  BP: 122/61 (!) 127/59  Pulse: 63 (!) 51  Resp: 18   Temp: 36.7 C 36.6 C  SpO2: 98% 100%    Last Pain:  Vitals:   02/19/19 0723  TempSrc: Oral  PainSc:                  Ricki Miller

## 2019-02-19 NOTE — TOC Benefit Eligibility Note (Signed)
Transition of Care [facility]) Benefit Eligibility Note    Patient Details  Name: Khayla Koppenhaver MRN: [id number] Date of Birth: [birth year]   Medication/Dose: Lovenox 40 mg daily x 14 days  Covered?: Yes     Prescription Coverage Preferred Pharmacy: [facility] in [place] with Person/Company/Phone Number:: [employer], [phone]  Co-Pay: $12.50        Additional Notes: Sharyn Lull said Rx had already been called in today.    [place] Phone Number: 02/19/2019, 11:51 AM

## 2019-02-19 NOTE — Evaluation (Signed)
Physical Therapy Evaluation Patient Details Name: Toni Paul MRN: [id number] DOB: 1922/12/02 Today's Date: 02/19/2019   History of Present Illness  Pt is a 85 y.o. female s/p R TKA secondary degenerative arthrosis  02/18/19.  PMH includes htn, endometrial CA, UTI.  Clinical Impression  Prior to hospital admission, pt was independent (use of cane outside as needed though).  Pt lives alone on main level of home with steps to enter.  Currently pt is CGA with transfers and ambulation 75 feet with RW.  0/10 R knee pain during session.  Able to perform R LE SLR independently (so no KI utilized).  R knee AROM 0-85 degrees.  Pt would benefit from skilled PT to address noted impairments and functional limitations (see below for any additional details).  Upon hospital discharge, recommend pt discharge with HHPT.    Follow Up Recommendations Home health PT    Equipment Recommendations  Rolling walker with 5" wheels;3in1 (PT)    Recommendations for Other Services OT consult     Precautions / Restrictions Precautions Precautions: Knee;Fall Precaution Booklet Issued: Yes (comment) Required Braces or Orthoses: Knee Immobilizer - Right Knee Immobilizer - Right: Discontinue once straight leg raise with < 10 degree lag Restrictions Weight Bearing Restrictions: Yes RLE Weight Bearing: Weight bearing as tolerated LLE Weight Bearing: Weight bearing as tolerated      Mobility  Bed Mobility               General bed mobility comments: Deferred (pt in chair at beginning and end of session)  Transfers Overall transfer level: Needs assistance Equipment used: Rolling walker (2 wheeled) Transfers: Sit to/from Stand Sit to Stand: Min guard         General transfer comment: vc's for UE/LE positioning with transfer from chair and use of walker; mild increased effort to stand on own  Ambulation/Gait Ambulation/Gait assistance: Min guard Gait Distance (Feet): 75 Feet Assistive device:  Rolling walker (2 wheeled)   Gait velocity: decreased   General Gait Details: step to progressing to partial step through gait pattern; initial vc's for walker use, sequencing, R quad set during R LE stance phase, and increasing UE support through RW to [employer] R LE  Stairs            Wheelchair Mobility    Modified Rankin (Stroke Patients Only)       Balance Overall balance assessment: Needs assistance Sitting-balance support: No upper extremity supported;Feet supported Sitting balance-Leahy Scale: Normal Sitting balance - Comments: steady sitting reaching outside BOS   Standing balance support: No upper extremity supported Standing balance-Leahy Scale: Fair Standing balance comment: steady static standing no UE support                             Pertinent Vitals/Pain Pain Assessment: 0-10 Pain Score: 0-No pain Pain Location: R knee Pain Intervention(s): Limited activity within patient's tolerance;Monitored during session;Premedicated before session;Repositioned;Other (comment)(polar care applied and activated)  HR 59-70 bpm during session; O2 sats WFL on room air.    Home Living Family/patient expects to be discharged to:: Private residence Living Arrangements: Alone   Type of Home: House Home Access: Stairs to enter Entrance Stairs-Rails: Psychiatric nurse of Steps: 2 Home Layout: Two level;Able to live on main level with bedroom/bathroom Home Equipment: Walker - 4 wheels;Cane - single point;Shower seat;Grab bars - toilet;Grab bars - tub/shower(bed pan (from pt's husband))      Prior Function Level of Independence:  Independent         Comments: Uses SPC outside as needed.  No falls in past 6 months.     Hand Dominance        Extremity/Trunk Assessment   Upper Extremity Assessment Upper Extremity Assessment: Overall WFL for tasks assessed    Lower Extremity Assessment Lower Extremity Assessment: RLE  deficits/detail(L LE WFL) RLE Deficits / Details: able to perform R LE SLR independently; good R quad set; at least 3/5 AROM DF/PF    Cervical / Trunk Assessment Cervical / Trunk Assessment: Normal  Communication   Communication: No difficulties  Cognition Arousal/Alertness: Awake/alert Behavior During Therapy: WFL for tasks assessed/performed Overall Cognitive Status: Within Functional Limits for tasks assessed                                        General Comments General comments (skin integrity, edema, etc.): R knee dressings and hemovac intact beginning/end of session.  Pt agreeable to PT session.    Exercises Total Joint Exercises Ankle Circles/Pumps: AROM;Strengthening;Both;10 reps(semi-supine in chair) Quad Sets: AROM;Strengthening;Right;10 reps(semi-supine in chair) Short Arc Quad: AROM;Strengthening;Right;10 reps(semi-supine in chair) Heel Slides: AAROM;Strengthening;Right;10 reps(semi-supine in chair) Hip ABduction/ADduction: AROM;Strengthening;Right;10 reps(semi-supine in chair) Straight Leg Raises: AROM;Strengthening;Right;10 reps(semi-supine in chair) Goniometric ROM: R knee AROM 0-85 degrees   Assessment/Plan    PT Assessment Patient needs continued PT services  PT Problem List Decreased strength;Decreased range of motion;Decreased activity tolerance;Decreased balance;Decreased mobility;Decreased knowledge of use of DME;Decreased knowledge of precautions;Pain;Decreased skin integrity       PT Treatment Interventions DME instruction;Gait training;Stair training;Functional mobility training;Therapeutic activities;Therapeutic exercise;Balance training;Patient/family education    PT Goals (Current goals can be found in the Care Plan section)  Acute Rehab PT Goals Patient Stated Goal: to go home PT Goal Formulation: With patient Time For Goal Achievement: 03/05/19 Potential to Achieve Goals: Good    Frequency BID   Barriers to discharge         Co-evaluation               AM-PAC PT "6 Clicks" Mobility  Outcome Measure Help needed turning from your back to your side while in a flat bed without using bedrails?: A Little Help needed moving from lying on your back to sitting on the side of a flat bed without using bedrails?: A Little Help needed moving to and from a bed to a chair (including a wheelchair)?: A Little Help needed standing up from a chair using your arms (e.g., wheelchair or bedside chair)?: A Little Help needed to walk in hospital room?: A Little Help needed climbing 3-5 steps with a railing? : A Little 6 Click Score: 18    End of Session Equipment Utilized During Treatment: Gait belt Activity Tolerance: Patient tolerated treatment well Patient left: in chair;with call bell/phone within reach;with chair alarm set;with SCD's reapplied;Other (comment)(R heel in bone foam; L heel floating via towel rolls; polar care in place and activated) Nurse Communication: Mobility status;Precautions;Weight bearing status(pt's pain status) PT Visit Diagnosis: Other abnormalities of gait and mobility (R26.89);Muscle weakness (generalized) (M62.81);Difficulty in walking, not elsewhere classified (R26.2);Pain Pain - Right/Left: Right Pain - part of body: Knee    Time: 4081-4481 PT Time Calculation (min) (ACUTE ONLY): 30 min   Charges:   PT Evaluation $PT Eval Low Complexity: 1 Low PT Treatments $Therapeutic Exercise: 8-22 mins       Leitha Bleak, PT 02/19/19, 10:18  AM [phone]

## 2019-02-19 NOTE — Progress Notes (Signed)
Physical Therapy Treatment Patient Details Name: Toni Paul MRN: [id number] DOB: 1923/01/19 Today's Date: 02/19/2019    History of Present Illness Pt is a 85 y.o. female s/p R TKA secondary degenerative arthrosis  02/18/19.  PMH includes htn, endometrial CA, UTI.    PT Comments    Pt reporting 0/10 R knee pain throughout PT session and wanting to walk more (pt reporting it made her R knee feel better in general). Pt able to progress to ambulating 200 feet with RW CGA; pt steady and safe with ambulation using RW.  Tolerated sitting ex's well.  Will continue to focus on strengthening, knee ROM, progressive ambulation, and trial stairs as appropriate next session.   Follow Up Recommendations  Home health PT     Equipment Recommendations  Rolling walker with 5" wheels;3in1 (PT)    Recommendations for Other Services OT consult     Precautions / Restrictions Precautions Precautions: Knee;Fall Precaution Booklet Issued: Yes (comment) Required Braces or Orthoses: Knee Immobilizer - Right Knee Immobilizer - Right: Discontinue once straight leg raise with < 10 degree lag Restrictions Weight Bearing Restrictions: Yes RLE Weight Bearing: Weight bearing as tolerated LLE Weight Bearing: Weight bearing as tolerated    Mobility  Bed Mobility               General bed mobility comments: Deferred (pt in chair at beginning and end of session)  Transfers Overall transfer level: Needs assistance Equipment used: Rolling walker (2 wheeled) Transfers: Sit to/from Stand Sit to Stand: Supervision;Min guard         General transfer comment: no vc's required for UE/LE positioning with transfer from chair and use of walker; mild increased effort to stand on own  Ambulation/Gait Ambulation/Gait assistance: Min guard Gait Distance (Feet): 200 Feet Assistive device: Rolling walker (2 wheeled)   Gait velocity: decreased   General Gait Details: step to progressing to partial  step through gait pattern; initial vc's for walker use, sequencing, and increasing UE support through RW to [employer] R LE   Stairs             Wheelchair Mobility    Modified Rankin (Stroke Patients Only)       Balance Overall balance assessment: Needs assistance Sitting-balance support: No upper extremity supported;Feet supported Sitting balance-Leahy Scale: Normal Sitting balance - Comments: steady sitting reaching outside BOS   Standing balance support: No upper extremity supported Standing balance-Leahy Scale: Good Standing balance comment: steady static standing reaching within BOS                            Cognition Arousal/Alertness: Awake/alert Behavior During Therapy: WFL for tasks assessed/performed Overall Cognitive Status: Within Functional Limits for tasks assessed                                        Exercises Total Joint Exercises Long Arc Quad: AROM;Strengthening;Right;10 reps;Seated Knee Flexion: AROM;Strengthening;Right;10 reps;Seated Other Exercises: R knee flexion AROM x2 reps (sitting in recliner) with 15 second holds with knee in flexed position    General Comments General comments (skin integrity, edema, etc.): R knee dressing, hemovac, and polar care in place at start/end of session.  Pt agreeable to PT session.      Pertinent Vitals/Pain Pain Assessment: No/denies pain Pain Score: 0-No pain Pain Location: R knee Pain Intervention(s): Limited activity within patient's  tolerance;Monitored during session;Repositioned;Other (comment)(polar care applied and activated)  HR 55-70 bpm during session's activities; O2 sats WFL on room air.    Home Living Family/patient expects to be discharged to:: Private residence Living Arrangements: Alone Available Help at Discharge: Family;Available 24 hours/day(Dtr.) Type of Home: House Home Access: Stairs to enter Entrance Stairs-Rails: Right;Left Home Layout: Two  level;Able to live on main level with bedroom/bathroom Home Equipment: Walker - 4 wheels;Cane - single point;Shower seat;Grab bars - toilet;Grab bars - tub/shower;Hand held shower head      Prior Function Level of Independence: Independent      Comments: Uses SPC outside as needed.  No falls in past 6 months.   PT Goals (current goals can now be found in the care plan section) Acute Rehab PT Goals Patient Stated Goal: to go home PT Goal Formulation: With patient Time For Goal Achievement: 03/05/19 Potential to Achieve Goals: Good Progress towards PT goals: Progressing toward goals    Frequency    BID      PT Plan Current plan remains appropriate    Co-evaluation              AM-PAC PT "6 Clicks" Mobility   Outcome Measure  Help needed turning from your back to your side while in a flat bed without using bedrails?: A Little Help needed moving from lying on your back to sitting on the side of a flat bed without using bedrails?: A Little Help needed moving to and from a bed to a chair (including a wheelchair)?: A Little Help needed standing up from a chair using your arms (e.g., wheelchair or bedside chair)?: A Little Help needed to walk in hospital room?: A Little Help needed climbing 3-5 steps with a railing? : A Little 6 Click Score: 18    End of Session Equipment Utilized During Treatment: Gait belt Activity Tolerance: Patient tolerated treatment well Patient left: in chair;with call bell/phone within reach;with chair alarm set;with SCD's reapplied;Other (comment)(R heel elevated in bone foam; L heel floating via towel roll; polar care in place and activated) Nurse Communication: Mobility status;Precautions;Weight bearing status PT Visit Diagnosis: Other abnormalities of gait and mobility (R26.89);Muscle weakness (generalized) (M62.81);Difficulty in walking, not elsewhere classified (R26.2);Pain Pain - Right/Left: Right Pain - part of body: Knee     Time:  9622-2979 PT Time Calculation (min) (ACUTE ONLY): 23 min  Charges:  $Gait Training: 8-22 mins $Therapeutic Exercise: 8-22 mins                    Leitha Bleak, PT 02/19/19, 2:13 PM [phone]

## 2019-02-19 NOTE — TOC Progression Note (Signed)
Transition of Care [facility]) - Progression Note    Patient Details  Name: Toni Paul MRN: [id number] Date of Birth: [birth year]  [facility] of [facility]) CM/SW [place], RN Phone Number: 02/19/2019, 9:24 AM  Clinical Narrative:     Requested the price for Lovenox will notify the patient once obtained       Expected Discharge Plan and Services                                                 Social Determinants of Health (SDOH) Interventions    Readmission Risk Interventions No flowsheet data found.

## 2019-02-19 NOTE — Progress Notes (Signed)
   Subjective: 1 Day Post-Op Procedure(s) (LRB): COMPUTER ASSISTED TOTAL KNEE ARTHROPLASTY - RNFA (Right) Patient reports pain as 5 on 0-10 scale.   Patient is well, and has had no acute complaints or problems We will start therapy today.  Plan is to go Home after hospital stay. no nausea and no vomiting Patient denies any chest pains or shortness of breath. Patient slept very well during the night.  States is better she slept in months. Voicing no complaints  Objective: Vital signs in last 24 hours: Temp:  [97.2 F (36.2 C)-98 F (36.7 C)] 98 F (36.7 C) (07/28 2335) Pulse Rate:  [50-73] 63 (07/28 2335) Resp:  [14-18] 18 (07/28 2335) BP: (119-148)/(53-73) 122/61 (07/28 2335) SpO2:  [98 %-100 %] 98 % (07/28 2335) Weight:  [61.5 kg] 61.5 kg (07/28 0948) Heels are non tender and elevated off the bed using rolled towels as well as bone foam under operative heel  Intake/Output from previous day: 07/28 0701 - 07/29 0700 In: 2385 [I.V.:1825; IV Piggyback:560] Out: 7672 [Urine:1550; Drains:150; Blood:50] Intake/Output this shift: Total I/O In: 925 [I.V.:725; IV Piggyback:200] Out: 1140 [Urine:1000; Drains:140]  No results for input(s): HGB in the last 72 hours. No results for input(s): WBC, RBC, HCT, PLT in the last 72 hours. No results for input(s): NA, K, CL, CO2, BUN, CREATININE, GLUCOSE, CALCIUM in the last 72 hours. No results for input(s): LABPT, INR in the last 72 hours.  EXAM General - Patient is Alert, Appropriate and Oriented Extremity - Neurologically intact Neurovascular intact Sensation intact distally Intact pulses distally Dorsiflexion/Plantar flexion intact Compartment soft Dressing - dressing C/D/I Motor Function - intact, moving foot and toes well on exam.  Is able to do straight leg raise on her own  Past Medical History:  Diagnosis Date  . Arthritis   . Endometrial cancer ([place])   . GERD (gastroesophageal reflux disease)    h/o  . Hypertension   .  UTI (urinary tract infection)     Assessment/Plan: 1 Day Post-Op Procedure(s) (LRB): COMPUTER ASSISTED TOTAL KNEE ARTHROPLASTY - RNFA (Right) Active Problems:   Total knee replacement status  Estimated body mass index is 23.27 kg/m as calculated from the following:   Height as of this encounter: 5\' 4"  (1.626 m).   Weight as of this encounter: 61.5 kg. Advance diet Up with therapy D/C IV fluids Plan for discharge tomorrow Discharge home with home health  Labs: None DVT Prophylaxis - Lovenox, Foot Pumps and TED hose Weight-Bearing as tolerated to right leg D/C O2 and Pulse OX and try on Room Air Begin working on bowel movement  Karigan Cloninger R. Kerman Sorrel 02/19/2019, 6:46 AM

## 2019-02-19 NOTE — Evaluation (Signed)
Occupational Therapy Evaluation Patient Details Name: Toni Paul MRN: [id number] DOB: 1922-08-22 Today's Date: 02/19/2019    History of Present Illness Pt is a 85 y.o. female s/p R TKA secondary degenerative arthrosis  02/18/19.  PMH includes htn, endometrial CA, UTI.   Clinical Impression   Toni Paul was seen for OT evaluation this date, POD#1 from above surgery. Pt was independent in all ADLs prior to surgery, however occasionally using a SPC for mobility outside secondary to R knee pain. Pt is eager to return to PLOF with less pain and improved safety and independence. Pt currently requires minimal assist for LB dressing while in seated position due to pain and limited AROM of R knee. Pt instructed in polar care mgt, falls prevention strategies, home/routines modifications, DME/AE for LB bathing and dressing tasks, and compression stocking mgt. Pt would benefit from skilled OT services including additional instruction in dressing techniques with or without assistive devices for dressing and bathing skills to support recall and carryover prior to discharge and ultimately to maximize safety, independence, and minimize falls risk and caregiver burden. Upon hospital DC recommend HHOT to maximize safety and functional independence during meaningful occupations of daily life.    Follow Up Recommendations  Home health OT    Equipment Recommendations  None recommended by OT    Recommendations for Other Services       Precautions / Restrictions Precautions Precautions: Knee;Fall Precaution Booklet Issued: Yes (comment) Required Braces or Orthoses: Knee Immobilizer - Right Knee Immobilizer - Right: Discontinue once straight leg raise with < 10 degree lag Restrictions Weight Bearing Restrictions: Yes RLE Weight Bearing: Weight bearing as tolerated LLE Weight Bearing: Weight bearing as tolerated      Mobility Bed Mobility               General bed mobility comments:  Deferred (pt in chair at beginning and end of session)  Transfers Overall transfer level: Needs assistance Equipment used: Rolling walker (2 wheeled) Transfers: Sit to/from Stand Sit to Stand: Min guard         General transfer comment: vc's for UE/LE positioning with transfer from chair and use of walker; mild increased effort to stand on own    Balance Overall balance assessment: Needs assistance Sitting-balance support: No upper extremity supported;Feet supported Sitting balance-Leahy Scale: Normal Sitting balance - Comments: steady sitting reaching outside BOS   Standing balance support: No upper extremity supported Standing balance-Leahy Scale: Fair Standing balance comment: steady static standing no UE support                           ADL either performed or assessed with clinical judgement   ADL Overall ADL's : Needs assistance/impaired Eating/Feeding: Sitting;Set up;Independent   Grooming: Sitting;Set up;Supervision/safety   Upper Body Bathing: Sitting;Set up;Minimal assistance Upper Body Bathing Details (indicate cue type and reason): Req assist to reach back. Lower Body Bathing: Sitting/lateral leans;Minimal assistance;Set up   Upper Body Dressing : Sitting;Supervision/safety;Set up   Lower Body Dressing: Sit to/from stand;Minimal assistance;Moderate assistance;Set up   Toilet Transfer: RW;BSC;Ambulation;Min guard   Toileting- Clothing Manipulation and Hygiene: Minimal assistance;Set up;Sit to/from stand       Functional mobility during ADLs: Min guard;Rolling walker;Cueing for safety       Vision Baseline Vision/History: Wears glasses Wears Glasses: At all times Patient Visual Report: No change from baseline       Perception     Praxis  Pertinent Vitals/Pain Pain Assessment: No/denies pain Pain Score: 0-No pain Pain Location: R knee Pain Intervention(s): Limited activity within patient's tolerance;Monitored during  session;Premedicated before session;Repositioned;Other (comment)(polar care applied and activated)     Hand Dominance Right   Extremity/Trunk Assessment Upper Extremity Assessment Upper Extremity Assessment: Overall WFL for tasks assessed   Lower Extremity Assessment Lower Extremity Assessment: RLE deficits/detail;Defer to PT evaluation RLE Deficits / Details: able to perform R LE SLR independently; good R quad set; at least 3/5 AROM DF/PF RLE Coordination: decreased gross motor;decreased fine motor   Cervical / Trunk Assessment Cervical / Trunk Assessment: Normal   Communication Communication Communication: No difficulties   Cognition Arousal/Alertness: Awake/alert Behavior During Therapy: WFL for tasks assessed/performed Overall Cognitive Status: Within Functional Limits for tasks assessed                                     General Comments  R knee dressing, hemovac, polar care in place at start end of session.    Exercises  Other Exercises: Pt educated in falls prevention strategies, safe use of AE for functional mobility and LB ADL tasks, polar care mgt, compression stocking mgt, and routines modifications to support safety and functional independence upon hospital DC. Handout provided.   Shoulder Instructions      Home Living Family/patient expects to be discharged to:: Private residence Living Arrangements: Alone Available Help at Discharge: Family;Available 24 hours/day(Dtr.) Type of Home: House Home Access: Stairs to enter [employer] of Steps: 2 Entrance Stairs-Rails: Right;Left Home Layout: Two level;Able to live on main level with bedroom/bathroom     Bathroom Shower/Tub: Occupational psychologist: Standard     Home Equipment: Environmental consultant - 4 wheels;Cane - single point;Shower seat;Grab bars - toilet;Grab bars - tub/shower;Hand held shower head          Prior Functioning/Environment Level of Independence: Independent         Comments: Uses SPC outside as needed.  No falls in past 6 months.        OT Problem List: Decreased strength;Decreased coordination;Decreased range of motion;Decreased safety awareness;Decreased activity tolerance;Decreased knowledge of use of DME or AE;Decreased knowledge of precautions;Impaired balance (sitting and/or standing)      OT Treatment/Interventions: Self-care/ADL training;Balance training;Therapeutic exercise;Therapeutic activities;DME and/or AE instruction;Patient/family education    OT Goals(Current goals can be found in the care plan section) Acute Rehab OT Goals Patient Stated Goal: to go home OT Goal Formulation: With patient Time For Goal Achievement: 03/05/19 Potential to Achieve Goals: Good ADL Goals Pt Will Perform Lower Body Bathing: with supervision;with set-up;sitting/lateral leans(With LRAD PRN for improved safety and functional independence.) Pt Will Perform Lower Body Dressing: sit to/from stand;with supervision;with set-up(With LRAD PRN for improved safety and functional independence.) Pt Will Transfer to Toilet: with set-up;with supervision;ambulating;bedside commode(With LRAD PRN for improved safety and functional independence.)  OT Frequency: Min 1X/week   Barriers to D/C:            Co-evaluation              AM-PAC OT "6 Clicks" Daily Activity     Outcome Measure Help from another person eating meals?: None Help from another person taking care of personal grooming?: A Little Help from another person toileting, which includes using toliet, bedpan, or urinal?: A Little Help from another person bathing (including washing, rinsing, drying)?: A Little Help from another person to put on and  taking off regular upper body clothing?: A Little Help from another person to put on and taking off regular lower body clothing?: A Little 6 Click Score: 19   End of Session Equipment Utilized During Treatment: Gait belt;Rolling walker  Activity  Tolerance: Patient tolerated treatment well Patient left: in chair;with chair alarm set;with call bell/phone within reach;with SCD's reapplied;Other (comment)(With polar care in place.)  OT Visit Diagnosis: Other abnormalities of gait and mobility (R26.89)                Time: 2763-9432 OT Time Calculation (min): 24 min Charges:  OT General Charges $OT Visit: 1 Visit OT Evaluation $OT Eval Low Complexity: 1 Low OT Treatments $Self Care/Home Management : 8-22 mins  Shara Blazing, M.S., OTR/L Ascom: [phone] 02/19/19, 12:15 PM

## 2019-02-19 NOTE — Discharge Summary (Addendum)
Physician Discharge Summary  Patient ID: Toni Paul MRN: [id number] DOB/AGE: [birth year] 85 y.o.  Admit date: 02/18/2019 Discharge date: 02/20/2019  Admission Diagnoses:  PRIMARY OSTEOARTHRITIS OF RIGHT KNEE   Discharge Diagnoses: Patient Active Problem List   Diagnosis Date Noted  . Total knee replacement status 02/18/2019  . Primary osteoarthritis of right knee 01/03/2019  . AV (anaerobic vaginosis) 09/28/2016  . Right lumbar radiculitis 10/19/2015  . Trigeminal neuralgia 06/14/2015  . CERVICALGIA 03/21/2010  . RUPTURE ROTATOR CUFF 03/21/2010  . Osteoarthrosis, unspecified whether generalized or localized, shoulder region 09/15/2009  . 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 IMPINGEMENT SYNDROME 09/15/2009    Past Medical History:  Diagnosis Date  . Arthritis   . Endometrial cancer ([place])   . GERD (gastroesophageal reflux disease)    h/o  . Hypertension   . UTI (urinary tract infection)      Transfusion: No transfusions during this admission   Consultants (if any): None  Discharged Condition: Improved  Hospital Course: Toni Paul is an 85 y.o. female who was admitted 02/18/2019 with a diagnosis of degenerative arthrosis right knee and went to the operating room on 02/18/2019 and underwent the above named procedures.    Surgeries:Procedure(s): COMPUTER ASSISTED TOTAL KNEE ARTHROPLASTY - RNFA on 02/18/2019  PRE-OPERATIVE DIAGNOSIS: Degenerative arthrosis of the right knee, primary  POST-OPERATIVE DIAGNOSIS:  Same  PROCEDURE:  Right total knee arthroplasty using computer-assisted navigation  SURGEON:  Marciano Sequin. M.D.  ASSISTANT:  Maximino Greenland, RN (present and scrubbed throughout the case, critical for assistance with exposure, retraction, instrumentation, and closure)  ANESTHESIA: spinal  ESTIMATED BLOOD LOSS: 50 mL  FLUIDS REPLACED: 1150 mL of crystalloid  TOURNIQUET TIME: 98 minutes  DRAINS: 2 medium  Hemovac drains  SOFT TISSUE RELEASES: Anterior cruciate ligament, posterior cruciate ligament, deep medial collateral ligament, patellofemoral ligament  IMPLANTS UTILIZED: DePuy Attune size 5 posterior stabilized femoral component (cemented), size 4 rotating platform tibial component (cemented), 38 mm medialized dome patella (cemented), and a 5 mm stabilized rotating platform polyethylene insert.  INDICATIONS FOR SURGERY: Toni Paul is a 85 y.o. year old female with a long history of progressive knee pain. X-rays demonstrated severe degenerative changes in tricompartmental fashion. The patient had not seen any significant improvement despite conservative nonsurgical intervention. After discussion of the risks and benefits of surgical intervention, the patient expressed understanding of the risks benefits and agree with plans for total knee arthroplasty.   The risks, benefits, and alternatives were discussed at length including but not limited to the risks of infection, bleeding, nerve injury, stiffness, blood clots, the need for revision surgery, cardiopulmonary complications, among others, and they were willing to proceed. Patient tolerated the surgery well. No complications .Patient was taken to PACU where she was stabilized and then transferred to the orthopedic floor.  Patient started on Lovenox 30 mg q 12 hrs. Foot pumps applied bilaterally at 80 mm hgb. Heels elevated off bed with rolled towels. No evidence of DVT. Calves non tender. Negative Homan. Physical therapy started on day #1 for gait training and transfer with OT starting on  day #1 for ADL and assisted devices. Patient has done well with therapy. Ambulated 200 feet upon being discharged.  Was able to ascend and descend 4 steps safely and independently  Patient's IV And Foley were discontinued on day #1 with Hemovac being discontinued on day #2. Dressing was changed on day 2 prior to patient being discharged   She was  given perioperative  antibiotics:  Anti-infectives (From admission, onward)   Start     Dose/Rate Route Frequency Ordered Stop   02/18/19 1830  ceFAZolin (ANCEF) IVPB 2g/100 mL premix     2 g 200 mL/hr over 30 Minutes Intravenous Every 6 hours 02/18/19 1822 02/19/19 1829   02/18/19 0948  ceFAZolin (ANCEF) 2-4 GM/100ML-% IVPB    Note to Pharmacy: Myles Lipps   : cabinet override      02/18/19 0948 02/18/19 2159   02/18/19 0600  ceFAZolin (ANCEF) IVPB 2g/100 mL premix  Status:  Discontinued     2 g 200 mL/hr over 30 Minutes Intravenous On call to O.R. 02/17/19 2211 02/18/19 1829    .  She was fitted with AV 1 compression foot pump devices, instructed on heel pumps, early ambulation, and fitted with TED stockings bilaterally for DVT prophylaxis.  She benefited maximally from the hospital stay and there were no complications.    Recent vital signs:  Vitals:   02/19/19 1518 02/19/19 2320  BP: 116/78 (!) 140/58  Pulse: (!) 59 (!) 49  Resp:  18  Temp: 98.3 F (36.8 C)   SpO2: 100% 98%    Recent laboratory studies:  Lab Results  Component Value Date   HGB 11.4 (L) 02/12/2019   HGB 12.6 02/13/2017   HGB 12.6 04/04/2016   Lab Results  Component Value Date   WBC 5.5 02/12/2019   PLT 249 02/12/2019   Lab Results  Component Value Date   INR 1.0 02/12/2019   Lab Results  Component Value Date   NA 142 02/12/2019   K 4.1 02/12/2019   CL 111 02/12/2019   CO2 24 02/12/2019   BUN 14 02/12/2019   CREATININE 0.60 02/12/2019   GLUCOSE 92 02/12/2019    Discharge Medications:   Allergies as of 02/20/2019      Reactions   Pollen Extract Other (See Comments)   Sneezing, runny nose      Medication List    STOP taking these medications   aspirin 325 MG EC tablet   aspirin EC 81 MG tablet   naproxen sodium 220 MG tablet Commonly known as: ALEVE     TAKE these medications   acetaminophen 500 MG chewable tablet Commonly known as: TYLENOL Chew 500 mg by mouth every 6  (six) hours as needed for pain.   calcium carbonate 1250 (500 Ca) MG tablet Commonly known as: OS-CAL - dosed in mg of elemental calcium Take 1 tablet by mouth 2 (two) times daily with a meal.   clobetasol ointment 0.05 % Commonly known as: TEMOVATE Apply 1 application topically 2 (two) times daily as needed (rash).   dorzolamide-timolol 22.3-6.8 MG/ML ophthalmic solution Commonly known as: COSOPT Place 1 drop into both eyes 2 (two) times daily.   enoxaparin 40 MG/0.4ML injection Commonly known as: LOVENOX Inject 0.4 mLs (40 mg total) into the skin daily for 14 days. Start taking on: February 21, 2019   losartan 100 MG tablet Commonly known as: COZAAR Take 100 mg by mouth every morning.   oxyCODONE 5 MG immediate release tablet Commonly known as: Oxy IR/ROXICODONE Take 1 tablet (5 mg total) by mouth every 4 (four) hours as needed for moderate pain (pain score 4-6).   traMADol 50 MG tablet Commonly known as: ULTRAM Take 1-2 tablets (50-100 mg total) by mouth every 6 (six) hours as needed for moderate pain.   Travoprost (BAK Free) 0.004 % Soln ophthalmic solution Commonly known as: TRAVATAN Place 1 drop into both  eyes at bedtime.            Durable Medical Equipment  (From admission, onward)         Start     Ordered   02/18/19 1823  DME Walker rolling  Once    Question:  Patient needs a walker to treat with the following condition  Answer:  Total knee replacement status   02/18/19 1822   02/18/19 1823  DME Bedside commode  Once    Question:  Patient needs a bedside commode to treat with the following condition  Answer:  Total knee replacement status   02/18/19 1822          Diagnostic Studies: Dg Knee Right Port  Result Date: 02/18/2019 CLINICAL DATA:  Postop right knee replacement. EXAM: PORTABLE RIGHT KNEE - 1-2 VIEW COMPARISON:  None. FINDINGS: Right knee replacement is identified without malalignment. Postoperative changes including skin staples and knee  drain are identified. IMPRESSION: Right knee replacement without malalignment.  Postoperative changes. Electronically Signed   By: Abelardo Diesel M.D.   On: 02/18/2019 16:17    Disposition: Discharge disposition: 01-Home or Self Care       Discharge Instructions    Increase activity slowly   Complete by: As directed       Follow-up Information    Watt Climes, PA On 03/04/2019.   Specialty: Physician Assistant Why: at 10:45am Contact information: [place] [address] [phone]        Dereck Leep, MD On 04/01/2019.   Specialty: Orthopedic Surgery Why: at 3:15pm Contact information: [place] [address] [phone]            Signed: Reche Dixon 02/20/2019, 6:49 AM

## 2019-02-20 NOTE — Progress Notes (Signed)
DISCHARGE NOTE:  Pts daughter Elvis Coil given discharge instructions over the phone (per pts request). All questioned answered. Surgical dressing changed, (honeycomb clean, dry and intact). TED hose on both legs. Pt waiting on daughter to pick her up.

## 2019-02-20 NOTE — TOC Transition Note (Signed)
Transition of Care [facility]) - CM/SW Discharge Note   Patient Details  Name: Toni Paul MRN: [id number] Date of Birth: Dec 26, 1922  [facility]) CM/SW Contact:  Su Hilt, RN Phone Number: 02/20/2019, 8:54 AM   Clinical Narrative:    Met with the patient to discuss DC plan and needs She lives alone but has 6 children that will take turns staying with her 24 hours a day/ She has DME at home including RW, can Shower chair, raised toilet with grab bars and a cane, no additional DME needed, Her Lovenox is $12.50 She has used [facility] before and would like to use them again, I notified Corene Cornea Her adult children provide transportation for her  She sees Dr Gordy Levan and is up to date, She uses [employer] and can afford her medication No additional needs at this time   Final next level of care: [place] Barriers to Discharge: Barriers Resolved   Patient Goals and CMS Choice Patient states their goals for this hospitalization and ongoing recovery are:: go home CMS Medicare.gov Compare Post Acute Care list provided to:: Patient Choice offered to / list presented to : Patient  Discharge Placement                       Discharge Plan and Services   Discharge Planning Services: CM Consult Post Acute Care Choice: Home Health          DME Arranged: N/A         HH Arranged: PT, OT [place] Agency: [place] ([facility]) Date HH Agency Contacted: 02/20/19 Time [place]: [phone] Representative spoke with at [place]: [place] ([place]) Interventions     Readmission Risk Interventions No flowsheet data found.

## 2019-02-20 NOTE — Progress Notes (Addendum)
   Subjective: 2 Days Post-Op Procedure(s) (LRB): COMPUTER ASSISTED TOTAL KNEE ARTHROPLASTY - RNFA (Right) Patient reports pain as 5 on 0-10 scale.   Patient is well, and has had no acute complaints or problems We will start therapy today.  Plan is to go Home after hospital stay. no nausea and no vomiting Patient denies any chest pains or shortness of breath. Patient slept very well during the night.  States is better she slept in months. Voicing no complaints  Objective: Vital signs in last 24 hours: Temp:  [97.6 F (36.4 C)-98.3 F (36.8 C)] 98.3 F (36.8 C) (07/29 1518) Pulse Rate:  [49-59] 49 (07/29 2320) Resp:  [18] 18 (07/29 2320) BP: (111-140)/(58-78) 140/58 (07/29 2320) SpO2:  [98 %-100 %] 98 % (07/29 2320) Heels are non tender and elevated off the bed using rolled towels as well as bone foam under operative heel  Intake/Output from previous day: 07/29 0701 - 07/30 0700 In: 120 [P.O.:120] Out: 160 [Drains:160] Intake/Output this shift: Total I/O In: -  Out: 50 [Drains:50]  No results for input(s): HGB in the last 72 hours. No results for input(s): WBC, RBC, HCT, PLT in the last 72 hours. No results for input(s): NA, K, CL, CO2, BUN, CREATININE, GLUCOSE, CALCIUM in the last 72 hours. No results for input(s): LABPT, INR in the last 72 hours.  EXAM General - Patient is Alert, Appropriate and Oriented Extremity - Neurologically intact Neurovascular intact Sensation intact distally Intact pulses distally Dorsiflexion/Plantar flexion intact Compartment soft Dressing - dressing C/D/I.  The Hemovac tubing was removed with no complication.  The tubing was intact on removal. Motor Function - intact, moving foot and toes well on exam.  Is able to do straight leg raise on her own  Past Medical History:  Diagnosis Date  . Arthritis   . Endometrial cancer ([place])   . GERD (gastroesophageal reflux disease)    h/o  . Hypertension   . UTI (urinary tract infection)      Assessment/Plan: 2 Days Post-Op Procedure(s) (LRB): COMPUTER ASSISTED TOTAL KNEE ARTHROPLASTY - RNFA (Right) Active Problems:   Total knee replacement status  Estimated body mass index is 23.27 kg/m as calculated from the following:   Height as of this encounter: 5\' 4"  (1.626 m).   Weight as of this encounter: 61.5 kg. Continue normal diet. Discharge planning today home.  Home health physical therapy. Physical therapy this morning.  Labs: None DVT Prophylaxis - Lovenox, Foot Pumps and TED hose Weight-Bearing as tolerated to right leg Continue working on bowel movement  Reche Dixon, PA-C [place] 02/20/2019, 6:46 AM

## 2019-02-20 NOTE — Progress Notes (Signed)
Physical Therapy Treatment Patient Details Name: Toni Paul MRN: [id number] DOB: 01/10/1923 Today's Date: 02/20/2019    History of Present Illness Pt is a 85 y.o. female s/p R TKA secondary degenerative arthrosis  02/18/19.  PMH includes htn, endometrial CA, UTI.    PT Comments    Pt received from sitting on recliner and agreeable to PT session. CGA throughout session today. Stairs negotiation 4 steps with practice RW transfer at top and bottom of the staircase. Vc's provided with hand placement and LE sequencing. Pt demonstrated good understanding of precaution. Ambulated 200 feet back to pt room from PT gym. Vc's provided with body positioning inside of RW once onset of LE fatigue during the last 50 feet of ambulation. Able to maintain good standing balance without UE support and washing hand in the sink after bathroom. Will improve muscle strength, endurance, and ROM with HHPT to increase ADLs tolerance and functional mobility.     Follow Up Recommendations  Home health PT     Equipment Recommendations  Rolling walker with 5" wheels;3in1 (PT)    Recommendations for Other Services OT consult     Precautions / Restrictions Precautions Precautions: Knee;Fall Precaution Booklet Issued: Yes (comment) Required Braces or Orthoses: Knee Immobilizer - Right Knee Immobilizer - Right: Discontinue once straight leg raise with < 10 degree lag Restrictions Weight Bearing Restrictions: Yes RLE Weight Bearing: Weight bearing as tolerated LLE Weight Bearing: Weight bearing as tolerated    Mobility  Bed Mobility               General bed mobility comments: [place]. Pt. states didn't use any assistant to get OOB before PT session.  Transfers Overall transfer level: Needs assistance Equipment used: Rolling walker (2 wheeled) Transfers: Sit to/from Stand Sit to Stand: Supervision         General transfer comment: vc's for UE placement with RW  management.  Ambulation/Gait Ambulation/Gait assistance: Min guard   Assistive device: Rolling walker (2 wheeled)   Gait velocity: decreased   General Gait Details: vc's for body positioning inside of RW; able to maintain step through gait pattern; fatigue noted at the end of the 50 feet but able to safely ambulating back to room.   Stairs Stairs: Yes Stairs assistance: Min guard Stair Management: Two rails Number of Stairs: 4 General stair comments: vc's for LE sequencing and RW management with hand and body positioning.   Wheelchair Mobility    Modified Rankin (Stroke Patients Only)       Balance Overall balance assessment: Needs assistance Sitting-balance support: No upper extremity supported;Feet supported Sitting balance-Leahy Scale: Normal Sitting balance - Comments: steady sitting reaching outside BOS   Standing balance support: No upper extremity supported Standing balance-Leahy Scale: Good Standing balance comment: steady static standing reaching within BOS                            Cognition Arousal/Alertness: Awake/alert Behavior During Therapy: WFL for tasks assessed/performed Overall Cognitive Status: Within Functional Limits for tasks assessed                                        Exercises Total Joint Exercises Ankle Circles/Pumps: AROM;Strengthening;Both;10 reps Quad Sets: AROM;Strengthening;Right;10 reps Goniometric ROM: R knee AROM 0-95 degrees General Exercises - Lower Extremity Hip Flexion/Marching: AROM;Strengthening;Both;10 reps;Seated    General Comments General comments (skin  integrity, edema, etc.): Honeycomb, polar care in place at start/end of session      Pertinent Vitals/Pain Pain Assessment: No/denies pain Pain Intervention(s): Monitored during session    Home Living                      Prior Function            PT Goals (current goals can now be found in the care plan section)  Acute Rehab PT Goals Patient Stated Goal: to go home PT Goal Formulation: With patient Time For Goal Achievement: 03/05/19 Potential to Achieve Goals: Good Progress towards PT goals: Progressing toward goals    Frequency    BID      PT Plan Current plan remains appropriate    Co-evaluation              AM-PAC PT "6 Clicks" Mobility   Outcome Measure  Help needed turning from your back to your side while in a flat bed without using bedrails?: A Little Help needed moving from lying on your back to sitting on the side of a flat bed without using bedrails?: A Little Help needed moving to and from a bed to a chair (including a wheelchair)?: A Little Help needed standing up from a chair using your arms (e.g., wheelchair or bedside chair)?: A Little Help needed to walk in hospital room?: A Little Help needed climbing 3-5 steps with a railing? : A Little 6 Click Score: 18    End of Session Equipment Utilized During Treatment: Gait belt Activity Tolerance: Patient tolerated treatment well Patient left: in chair;with call bell/phone within reach;with chair alarm set;with SCD's reapplied;Other (comment)(R heel elevated via towel roll; L heel floating via towel roll; polar care in place and activated) Nurse Communication: Mobility status;Precautions;Weight bearing status PT Visit Diagnosis: Other abnormalities of gait and mobility (R26.89);Muscle weakness (generalized) (M62.81);Difficulty in walking, not elsewhere classified (R26.2);Pain Pain - Right/Left: Right Pain - part of body: Knee     Time: 9407-6808 PT Time Calculation (min) (ACUTE ONLY): 39 min  Charges:                          Sherrilyn Rist, SPT 02/20/2019, 1:06 PM

## 2019-02-20 NOTE — Progress Notes (Signed)
Pt wheeled to car by staff, daughter Elvis Coil providing transportation.

## 2019-02-20 NOTE — Progress Notes (Signed)
   02/20/19 1600  Clinical Encounter Type  Visited With Patient not available  Visit Type Follow-up  Referral From Nurse  Consult/Referral To Chaplain   Chaplain received a referral to visit the patient from the Unit Asst. Director. Patient unavailable (perhaps in the bathroom) at this time. Will attempt at a later time.

## 2020-08-24 ENCOUNTER — Other Ambulatory Visit: Payer: Self-pay | Admitting: Neurology

## 2020-08-24 DIAGNOSIS — G501 Atypical facial pain: Secondary | ICD-10-CM

## 2020-09-01 ENCOUNTER — Other Ambulatory Visit: Payer: Self-pay

## 2020-09-01 ENCOUNTER — Ambulatory Visit
Admission: RE | Admit: 2020-09-01 | Discharge: 2020-09-01 | Disposition: A | Discharge status: Home / Self Care | Payer: Medicare Other | Source: Ambulatory Visit | Attending: Neurology | Admitting: Neurology

## 2020-09-01 DIAGNOSIS — G501 Atypical facial pain: Secondary | ICD-10-CM | POA: Insufficient documentation

## 2020-09-01 DIAGNOSIS — M4312 Spondylolisthesis, cervical region: Secondary | ICD-10-CM | POA: Insufficient documentation

## 2020-09-01 DIAGNOSIS — M47812 Spondylosis without myelopathy or radiculopathy, cervical region: Secondary | ICD-10-CM | POA: Diagnosis not present

## 2020-09-01 MED ORDER — GADOBUTROL 1 MMOL/ML IV SOLN
6.0000 mL | Freq: Once | INTRAVENOUS | Status: AC | PRN
  Administered 2020-09-01: 6 mL via INTRAVENOUS

## 2021-03-23 ENCOUNTER — Other Ambulatory Visit: Payer: Self-pay | Admitting: Physician Assistant

## 2021-03-23 DIAGNOSIS — M25561 Pain in right knee: Secondary | ICD-10-CM

## 2021-04-04 ENCOUNTER — Encounter
Admission: RE | Admit: 2021-04-04 | Discharge: 2021-04-04 | Disposition: A | Discharge status: Home / Self Care | Payer: Medicare Other | Source: Ambulatory Visit | Attending: Physician Assistant | Admitting: Physician Assistant

## 2021-04-04 ENCOUNTER — Ambulatory Visit
Admission: RE | Admit: 2021-04-04 | Discharge: 2021-04-04 | Disposition: A | Discharge status: Home / Self Care | Payer: Medicare Other | Source: Ambulatory Visit | Attending: Physician Assistant | Admitting: Physician Assistant

## 2021-04-04 ENCOUNTER — Other Ambulatory Visit: Payer: Self-pay

## 2021-04-04 DIAGNOSIS — M25561 Pain in right knee: Secondary | ICD-10-CM | POA: Diagnosis present

## 2021-09-06 ENCOUNTER — Other Ambulatory Visit: Payer: Self-pay

## 2021-09-06 ENCOUNTER — Emergency Department
Admission: EM | Admit: 2021-09-06 | Discharge: 2021-09-06 | Disposition: A | Discharge status: Home / Self Care | Payer: Medicare Other | Attending: Emergency Medicine | Admitting: Emergency Medicine

## 2021-09-06 ENCOUNTER — Emergency Department: Payer: Medicare Other

## 2021-09-06 ENCOUNTER — Encounter: Payer: Self-pay | Admitting: Emergency Medicine

## 2021-09-06 DIAGNOSIS — K5900 Constipation, unspecified: Secondary | ICD-10-CM

## 2021-09-06 DIAGNOSIS — Z96659 Presence of unspecified artificial knee joint: Secondary | ICD-10-CM | POA: Insufficient documentation

## 2021-09-06 DIAGNOSIS — Z8542 Personal history of malignant neoplasm of other parts of uterus: Secondary | ICD-10-CM | POA: Diagnosis not present

## 2021-09-06 DIAGNOSIS — I10 Essential (primary) hypertension: Secondary | ICD-10-CM | POA: Diagnosis not present

## 2021-09-06 DIAGNOSIS — R6884 Jaw pain: Secondary | ICD-10-CM | POA: Diagnosis not present

## 2021-09-06 DIAGNOSIS — M25511 Pain in right shoulder: Secondary | ICD-10-CM | POA: Diagnosis not present

## 2021-09-06 DIAGNOSIS — R109 Unspecified abdominal pain: Secondary | ICD-10-CM | POA: Diagnosis present

## 2021-09-06 LAB — BASIC METABOLIC PANEL
Anion gap: 5 (ref 5–15)
BUN: 17 mg/dL (ref 8–23)
CO2: 25 mmol/L (ref 22–32)
Calcium: 9.2 mg/dL (ref 8.9–10.3)
Chloride: 110 mmol/L (ref 98–111)
Creatinine, Ser: 0.64 mg/dL (ref 0.44–1.00)
GFR, Estimated: >60 mL/min (ref >60)
Glucose, Bld: 80 mg/dL (ref 70–99)
Potassium: 3.5 mmol/L (ref 3.5–5.1)
Sodium: 140 mmol/L (ref 135–145)

## 2021-09-06 LAB — URINALYSIS, COMPLETE (UACMP) WITH MICROSCOPIC
Bilirubin Urine: NEGATIVE
Glucose, UA: NEGATIVE mg/dL
Hgb urine dipstick: NEGATIVE
Ketones, ur: NEGATIVE mg/dL
Nitrite: NEGATIVE
Protein, ur: NEGATIVE mg/dL
Specific Gravity, Urine: 1.031 — ABNORMAL HIGH (ref 1.005–1.030)
pH: 5 (ref 5.0–8.0)

## 2021-09-06 LAB — CBC
HCT: 36 % (ref 36.0–46.0)
Hemoglobin: 11.3 g/dL — ABNORMAL LOW (ref 12.0–15.0)
MCH: 30.1 pg (ref 26.0–34.0)
MCHC: 31.4 g/dL (ref 30.0–36.0)
MCV: 95.7 fL (ref 80.0–100.0)
Platelets: 242 10*3/uL (ref 150–400)
RBC: 3.76 MIL/uL — ABNORMAL LOW (ref 3.87–5.11)
RDW: 13.2 % (ref 11.5–15.5)
WBC: 5.6 10*3/uL (ref 4.0–10.5)
nRBC: 0 % (ref 0.0–0.2)

## 2021-09-06 LAB — TROPONIN I (HIGH SENSITIVITY): Troponin I (High Sensitivity): 4 ng/L (ref <18)

## 2021-09-06 MED ORDER — POLYETHYLENE GLYCOL 3350 17 G PO PACK: 17.0000 g | PACK | Freq: Every day | ORAL | 0 refills | Status: AC

## 2021-09-06 MED ORDER — IOHEXOL 300 MG/ML  SOLN
100.0000 mL | Freq: Once | INTRAMUSCULAR | Status: AC | PRN
  Administered 2021-09-06: 100 mL via INTRAVENOUS
  Filled 2021-09-06: qty 100

## 2021-09-06 NOTE — ED Provider Notes (Signed)
[facility] Provider Note    Event Date/Time   First MD Initiated Contact with Patient 09/06/21 [phone]     (approximate)   History   Abdominal Pain   HPI  Toni Paul is a 88 y.o. female with past medical history of hypertension, endometrial cancer, GERD who presents with abdominal pain shoulder pain jaw pain.  Patient notes that she has had right shoulder pain for several weeks.  Initially got a steroid injection and it improved.  She then started having some pain on the right side of her jaw which since moved to the left side of her jaw.  Then she developed abdominal pain.  She called her primary care physician regarding these multiple complaints, she had an EKG which apparently was abnormal so they told her to come to the emergency department.  Patient denies chest pain or shortness of breath.  She does endorse ongoing right shoulder pain that is worse when she lies on it and worse at night.  She denies cough fevers or chills.  Has had some nausea but no vomiting no diarrhea or constipation denies urinary symptoms.  Abdominal pain is intermittent, is worse in the lower abdomen.  Still eating and drinking.    Past Medical History:  Diagnosis Date   Arthritis    Endometrial cancer ([place])    GERD (gastroesophageal reflux disease)    h/o   Hypertension    UTI (urinary tract infection)     Patient Active Problem List   Diagnosis Date Noted   Total knee replacement status 02/18/2019   Primary osteoarthritis of right knee 01/03/2019   AV (anaerobic vaginosis) 09/28/2016   Right lumbar radiculitis 10/19/2015   Trigeminal neuralgia 06/14/2015   CERVICALGIA 03/21/2010   RUPTURE ROTATOR CUFF 03/21/2010   Osteoarthrosis, unspecified whether generalized or localized, shoulder region 09/15/2009   DEGENERATIVE DISC DISEASE, LUMBAR SPINE 09/15/2009   IMPINGEMENT SYNDROME 09/15/2009     Physical Exam  Triage Vital Signs: ED Triage Vitals  Enc Vitals  Group     BP 09/06/21 1720 (!) 137/56     Pulse Rate 09/06/21 1720 (!) 55     Resp 09/06/21 1720 20     Temp 09/06/21 1720 98 F (36.7 C)     Temp Source 09/06/21 1720 Oral     SpO2 09/06/21 1720 96 %     Weight 09/06/21 1722 135 lb 9.3 oz (61.5 kg)     Height 09/06/21 1722 5\' 4"  (1.626 m)     Head Circumference --      Peak Flow --      Pain Score 09/06/21 1720 10     Pain Loc --      Pain Edu? --      Excl. in [place]? --     Most recent vital signs: Vitals:   09/06/21 1720 09/06/21 2011  BP: (!) 137/56 129/70  Pulse: (!) 55 63  Resp: 20 18  Temp: 98 F (36.7 C)   SpO2: 96% 99%     General: Awake, no distress.  CV:  Good peripheral perfusion.  Resp:  Normal effort.  Abd:  No distention.  Mild tenderness to palpation in the suprapubic region, no guarding Neuro:             Awake, Alert, Oriented x 3  Other:     ED Results / Procedures / Treatments  Labs (all labs ordered are listed, but only abnormal results are displayed) Labs Reviewed  CBC -  Abnormal; Notable for the following components:      Result Value   RBC 3.76 (*)    Hemoglobin 11.3 (*)    All other components within normal limits  URINALYSIS, COMPLETE (UACMP) WITH MICROSCOPIC - Abnormal; Notable for the following components:   Color, Urine AMBER (*)    APPearance HAZY (*)    Specific Gravity, Urine 1.031 (*)    Leukocytes,Ua SMALL (*)    Bacteria, UA RARE (*)    All other components within normal limits  BASIC METABOLIC PANEL  TROPONIN I (HIGH SENSITIVITY)     EKG  EKG interpreted by myself, sinus bradycardia normal axis no acute ischemic changes   RADIOLOGY I reviewed the CXR which does not show any acute cardiopulmonary process; agree with radiology report     PROCEDURES:  Critical Care performed: No    MEDICATIONS ORDERED IN ED: Medications  iohexol (OMNIPAQUE) 300 MG/ML solution 100 mL (100 mLs Intravenous Contrast Given 09/06/21 2020)     IMPRESSION / MDM / ASSESSMENT AND PLAN  / ED COURSE  I reviewed the triage vital signs and the nursing notes.                              Differential diagnosis includes, but is not limited to, UTI, constipation, bowel obstruction, diverticulitis, colitis  Patient is a 88 year old female who presents with several complaints today primarily abdominal pain.  Vital signs within normal limits she is well-appearing on exam.  She does have some mild suprapubic and left lower quadrant tenderness.  I reviewed her labs which are all reassuring.  No leukocytosis.  Troponin was sent given the complaint of shoulder pain and this is negative.  I reviewed her EKG which she was sent here for due to it being abnormal and there were no acute ischemic changes.  Her shoulder pain seems to be musculoskeletal.  Unclear was causing the pain on the side of her face, her son thinks it could be related to her dentures.  She complains of feeling some numbness on the left side of her face but there is no asymmetry and she has no other abnormal neurologic findings to suggest CVA on neurologic exam.  We will plan to obtain a CT of the abdomen to rule out acute surgical process and obtain a UA to rule out UTI.  CT abdomen pelvis is negative for acute intra-abdominal process.  There is moderate constipation.  Patient prescribed MiraLAX.  Vies to follow-up with PCP.      FINAL CLINICAL IMPRESSION(S) / ED DIAGNOSES   Final diagnoses:  Constipation, unspecified constipation type     Rx / DC Orders   ED Discharge Orders          Ordered    polyethylene glycol (MIRALAX) 17 g packet  Daily        09/06/21 2050             Note:  This document was prepared using Dragon voice recognition software and may include unintentional dictation errors.   Rada Hay, MD 09/06/21 2051

## 2021-09-06 NOTE — ED Provider Triage Note (Signed)
Emergency Medicine Provider Triage Evaluation Note  Toni Paul , a 88 y.o. female  was evaluated in triage.  Pt complains of chest pain, jaw pain and abdominal discomfort that started acutely.  Patient was referred by her primary care provider for abnormal EKG.  Review of Systems  Positive: Patient has chest pain, nausea and jaw pain.  Negative: No chest tightness or sob.   Physical Exam  There were no vitals taken for this visit. Gen:   Awake, no distress   Resp:  Normal effort  MSK:   Moves extremities without difficulty  Other:    Medical Decision Making  Medically screening exam initiated at 5:22 PM.  Appropriate orders placed.  Toni Paul was informed that the remainder of the evaluation will be completed by another provider, this initial triage assessment does not replace that evaluation, and the importance of remaining in the ED until their evaluation is complete.     Toni Paul, [place] 09/06/21 1723

## 2021-09-06 NOTE — ED Notes (Signed)
Pt placed on monitor.  

## 2021-09-06 NOTE — ED Triage Notes (Signed)
Pt to ED via POV with c/o abd pain, right shoulder pain and left sided jaw pain. She went to her PMD and they sent her here because they told her that her EKG was abnormal. She did have some nausea and vomiting. Denies diaphoresis or SHOB

## 2021-09-06 NOTE — ED Notes (Signed)
Pt reports EKG taken during triage; unsure why not transfering over into chart.

## 2021-09-06 NOTE — ED Notes (Signed)
See triage note. Pt laying calmly on stretcher; resp reg/unlabored; skin dry. Visitor remains with pt.

## 2021-12-16 ENCOUNTER — Emergency Department
Admission: EM | Admit: 2021-12-16 | Discharge: 2021-12-16 | Disposition: A | Discharge status: Home / Self Care | Payer: Medicare Other | Attending: Emergency Medicine | Admitting: Emergency Medicine

## 2021-12-16 ENCOUNTER — Encounter: Payer: Self-pay | Admitting: Medical Oncology

## 2021-12-16 DIAGNOSIS — I10 Essential (primary) hypertension: Secondary | ICD-10-CM | POA: Insufficient documentation

## 2021-12-16 DIAGNOSIS — R300 Dysuria: Secondary | ICD-10-CM | POA: Diagnosis present

## 2021-12-16 LAB — URINALYSIS, ROUTINE W REFLEX MICROSCOPIC
Bilirubin Urine: NEGATIVE
Glucose, UA: NEGATIVE mg/dL
Hgb urine dipstick: NEGATIVE
Ketones, ur: NEGATIVE mg/dL
Leukocytes,Ua: NEGATIVE
Nitrite: NEGATIVE
Protein, ur: NEGATIVE mg/dL
Specific Gravity, Urine: 1.026 (ref 1.005–1.030)
pH: 7 (ref 5.0–8.0)

## 2021-12-16 LAB — CBC
HCT: 33.8 % — ABNORMAL LOW (ref 36.0–46.0)
Hemoglobin: 10.5 g/dL — ABNORMAL LOW (ref 12.0–15.0)
MCH: 30.4 pg (ref 26.0–34.0)
MCHC: 31.1 g/dL (ref 30.0–36.0)
MCV: 98 fL (ref 80.0–100.0)
Platelets: 239 10*3/uL (ref 150–400)
RBC: 3.45 MIL/uL — ABNORMAL LOW (ref 3.87–5.11)
RDW: 13.5 % (ref 11.5–15.5)
WBC: 6.6 10*3/uL (ref 4.0–10.5)
nRBC: 0 % (ref 0.0–0.2)

## 2021-12-16 LAB — BASIC METABOLIC PANEL
Anion gap: 8 (ref 5–15)
BUN: 19 mg/dL (ref 8–23)
CO2: 26 mmol/L (ref 22–32)
Calcium: 9.2 mg/dL (ref 8.9–10.3)
Chloride: 110 mmol/L (ref 98–111)
Creatinine, Ser: 0.71 mg/dL (ref 0.44–1.00)
GFR, Estimated: >60 mL/min (ref >60)
Glucose, Bld: 97 mg/dL (ref 70–99)
Potassium: 3.8 mmol/L (ref 3.5–5.1)
Sodium: 144 mmol/L (ref 135–145)

## 2021-12-16 NOTE — ED Triage Notes (Addendum)
Pt from [facility] with reports that she woke up this am at 0200 and urinated with some burning at 0500 she got up again to urinate and began having worse pain with urination and was unable to get anything out. Denies fever. Feels similar to time she has had UTI before. Pt reports that she is feeling dizzy also.

## 2021-12-16 NOTE — ED Provider Notes (Signed)
[facility] Emergency Department Provider Note     None    (approximate)   History   Dysuria   HPI  Toni Paul is a 88 y.o. female with a history of osteoarthritis, hypertension, and GERD, presents to the ED for evaluation of dysuria.  Patient presents from [facility] when she woke up this morning at 2 AM with some burning with urination.  She had a recurrent episode at about 5 AM and then began to experience some urinary retention.  She denies any fevers, nausea, vomiting patient does endorse some mild dizziness.  She reports symptoms are similar to her previous UTI.     Physical Exam   Triage Vital Signs: ED Triage Vitals  Enc Vitals Group     BP 12/16/21 0855 (!) 177/77     Pulse Rate 12/16/21 0855 (!) 52     Resp 12/16/21 0855 16     Temp 12/16/21 0855 98 F (36.7 C)     Temp Source 12/16/21 0855 Oral     SpO2 12/16/21 0855 98 %     Weight 12/16/21 0856 127 lb (57.6 kg)     Height 12/16/21 0856 '5\' 2"'$  (1.575 m)     Head Circumference --      Peak Flow --      Pain Score 12/16/21 0856 0     Pain Loc --      Pain Edu? --      Excl. in [place]? --     Most recent vital signs: Vitals:   12/16/21 0855 12/16/21 1229  BP: (!) 177/77 (!) 160/62  Pulse: (!) 52 (!) 54  Resp: 16 16  Temp: 98 F (36.7 C) 98.1 F (36.7 C)  SpO2: 98% 97%    General Awake, no distress.  CV:  Good peripheral perfusion.  RESP:  Normal effort.  ABD:  No distention. Soft, nontender. Normal bowel sounds GU:  Normal external genitalia.  No sign of any skin breakdown, erythema, induration, or abscess.  Patient with some skin changes consistent with a history of lichen planus.   ED Results / Procedures / Treatments   Labs (all labs ordered are listed, but only abnormal results are displayed) Labs Reviewed  CBC - Abnormal; Notable for the following components:      Result Value   RBC 3.45 (*)    Hemoglobin 10.5 (*)    HCT 33.8 (*)    All other components  within normal limits  URINALYSIS, ROUTINE W REFLEX MICROSCOPIC - Abnormal; Notable for the following components:   Color, Urine YELLOW (*)    APPearance HAZY (*)    All other components within normal limits  BASIC METABOLIC PANEL     EKG  Vent. rate 50 BPM PR interval 196 ms QRS duration 70 ms QT/QTcB 470/428 ms P-R-T axes 45 51 54 Sinus brady  RADIOLOGY    No results found.   PROCEDURES:  Critical Care performed: No  Procedures   MEDICATIONS ORDERED IN ED: Medications - No data to display   IMPRESSION / MDM / [place] / ED COURSE  I reviewed the triage vital signs and the nursing notes.                              Differential diagnosis includes, but is not limited to, urinary retention, UTI, vaginitis  Geriatric patient to the ED for evaluation of complaints of urinary retention  and dysuria.  Patient is evaluated for complaints in ED, found have reassuring work-up overall.  No laboratory evidence of acute leukocytosis, critical anemia, or electrolyte abnormality.  No UA evidence of leukocyturia or hematuria.  Bladder scan reveals less than 20 cc, without any signs of urinary retention.  Patient's diagnosis is consistent with dysuria. Patient will be discharged home with instructions to continue to hydrate and empty her bladder regularly.  Patient is to follow up with her primary provider for ongoing symptoms as needed or otherwise directed. Patient is given ED precautions to return to the ED for any worsening or new symptoms.   FINAL CLINICAL IMPRESSION(S) / ED DIAGNOSES   Final diagnoses:  Dysuria     Rx / DC Orders   ED Discharge Orders     None        Note:  This document was prepared using Dragon voice recognition software and may include unintentional dictation errors.    Melvenia Needles, PA-C 12/16/21 1412    Harvest Dark, MD 12/16/21 1527

## 2021-12-16 NOTE — Discharge Instructions (Addendum)
Your exam, labs, and bladder scan test are all normal and reassuring today.  Follow-up with your primary provider for ongoing symptoms.

## 2021-12-16 NOTE — ED Notes (Signed)
DC ppw provided to patient. RX info and followup information reviewed. PT questions answered. pt declines vs at time of DC. PT provides verbal consent for dc. Pt ambulatory to lobby alert and oriented x4. 

## 2022-04-29 ENCOUNTER — Emergency Department: Payer: Medicare Other

## 2022-04-29 ENCOUNTER — Other Ambulatory Visit: Payer: Self-pay

## 2022-04-29 ENCOUNTER — Emergency Department
Admission: EM | Admit: 2022-04-29 | Discharge: 2022-04-29 | Disposition: A | Discharge status: Home / Self Care | Payer: Medicare Other | Attending: Emergency Medicine | Admitting: Emergency Medicine

## 2022-04-29 DIAGNOSIS — K5909 Other constipation: Secondary | ICD-10-CM

## 2022-04-29 DIAGNOSIS — R109 Unspecified abdominal pain: Secondary | ICD-10-CM | POA: Diagnosis present

## 2022-04-29 DIAGNOSIS — K5900 Constipation, unspecified: Secondary | ICD-10-CM | POA: Insufficient documentation

## 2022-04-29 LAB — CBC
HCT: 32.6 % — ABNORMAL LOW (ref 36.0–46.0)
Hemoglobin: 10.2 g/dL — ABNORMAL LOW (ref 12.0–15.0)
MCH: 29.7 pg (ref 26.0–34.0)
MCHC: 31.3 g/dL (ref 30.0–36.0)
MCV: 95 fL (ref 80.0–100.0)
Platelets: 264 10*3/uL (ref 150–400)
RBC: 3.43 MIL/uL — ABNORMAL LOW (ref 3.87–5.11)
RDW: 13.9 % (ref 11.5–15.5)
WBC: 4.1 10*3/uL (ref 4.0–10.5)
nRBC: 0 % (ref 0.0–0.2)

## 2022-04-29 LAB — URINALYSIS, ROUTINE W REFLEX MICROSCOPIC
Bilirubin Urine: NEGATIVE
Glucose, UA: NEGATIVE mg/dL
Hgb urine dipstick: NEGATIVE
Ketones, ur: 5 mg/dL — AB
Leukocytes,Ua: NEGATIVE
Nitrite: NEGATIVE
Protein, ur: NEGATIVE mg/dL
Specific Gravity, Urine: 1.025 (ref 1.005–1.030)
pH: 5 (ref 5.0–8.0)

## 2022-04-29 LAB — COMPREHENSIVE METABOLIC PANEL
ALT: 11 U/L (ref 0–44)
AST: 17 U/L (ref 15–41)
Albumin: 4.1 g/dL (ref 3.5–5.0)
Alkaline Phosphatase: 57 U/L (ref 38–126)
Anion gap: 5 (ref 5–15)
BUN: 21 mg/dL (ref 8–23)
CO2: 27 mmol/L (ref 22–32)
Calcium: 9.1 mg/dL (ref 8.9–10.3)
Chloride: 114 mmol/L — ABNORMAL HIGH (ref 98–111)
Creatinine, Ser: 0.54 mg/dL (ref 0.44–1.00)
GFR, Estimated: >60 mL/min (ref >60)
Glucose, Bld: 113 mg/dL — ABNORMAL HIGH (ref 70–99)
Potassium: 3.7 mmol/L (ref 3.5–5.1)
Sodium: 146 mmol/L — ABNORMAL HIGH (ref 135–145)
Total Bilirubin: 0.4 mg/dL (ref 0.3–1.2)
Total Protein: 6.7 g/dL (ref 6.5–8.1)

## 2022-04-29 LAB — LIPASE, BLOOD: Lipase: 33 U/L (ref 11–51)

## 2022-04-29 NOTE — ED Triage Notes (Signed)
Pt states that she started having abd pain x2 months- pt has been back and forth to the doctor for it and they give her medication that gives her diarrhea but then she will be back to constipated again and unable to go- pt also had some vomiting, but has not had any today- pt also c/o reflux where she feel like she has to spit all the time

## 2022-04-29 NOTE — ED Provider Notes (Signed)
[facility] Provider Note  Patient Contact: 4:01 PM (approximate)   History   Abdominal Pain   HPI  Toni Paul is a 88 y.o. female who presents the emergency department complaining of vague, nonspecific abdominal pain that is reportedly been ongoing x2 months.  Patient is having bouts of constipation followed by diarrhea.  No fevers or chills.  No emesis.  Patient has been using medications to alleviate diarrhea which will then follow with constipation.  Unsure what these medications are as patient and daughter is not sure.  No history of chronic GI problems such as Crohn's, colitis, diverticulitis.  No urinary changes.     Physical Exam   Triage Vital Signs: ED Triage Vitals  Enc Vitals Group     BP 04/29/22 1238 137/62     Pulse Rate 04/29/22 1238 70     Resp 04/29/22 1238 18     Temp 04/29/22 1238 98.6 F (37 C)     Temp Source 04/29/22 1238 Oral     SpO2 04/29/22 1238 94 %     Weight 04/29/22 1239 117 lb (53.1 kg)     Height 04/29/22 1239 5' (1.524 m)     Head Circumference --      Peak Flow --      Pain Score 04/29/22 1239 9     Pain Loc --      Pain Edu? --      Excl. in [place]? --     Most recent vital signs: Vitals:   04/29/22 1756 04/29/22 1757  BP: 135/70   Pulse: 76 76  Resp: 16   Temp:    SpO2: 98% 100%     General: Alert and in no acute distress  Cardiovascular:  Good peripheral perfusion Respiratory: Normal respiratory effort without tachypnea or retractions. Lungs CTAB.  Gastrointestinal: Bowel sounds 4 quadrants.  Soft to palpation.  Slight tenderness in the epigastric region as well as left upper and lower quadrants.  Very mild in nature.  No guarding or rigidity noted..  No other tender to palpation..  No palpable masses. No distention. No CVA tenderness. Musculoskeletal: Full range of motion to all extremities.  Neurologic:  No gross focal neurologic deficits are appreciated.  Skin:   No rash  noted Other:   ED Results / Procedures / Treatments   Labs (all labs ordered are listed, but only abnormal results are displayed) Labs Reviewed  COMPREHENSIVE METABOLIC PANEL - Abnormal; Notable for the following components:      Result Value   Sodium 146 (*)    Chloride 114 (*)    Glucose, Bld 113 (*)    All other components within normal limits  CBC - Abnormal; Notable for the following components:   RBC 3.43 (*)    Hemoglobin 10.2 (*)    HCT 32.6 (*)    All other components within normal limits  URINALYSIS, ROUTINE W REFLEX MICROSCOPIC - Abnormal; Notable for the following components:   Color, Urine YELLOW (*)    APPearance HAZY (*)    Ketones, ur 5 (*)    All other components within normal limits  LIPASE, BLOOD     EKG     RADIOLOGY  I personally viewed, evaluated, and interpreted these images as part of my medical decision making, as well as reviewing the written report by the radiologist.  ED Provider Interpretation: CT scan without acute findings.  Some stool throughout the colon.  No bladder wall thickening, nephrolithiasis, evidence  of colitis or diverticulitis  CT ABDOMEN PELVIS WO CONTRAST  Result Date: 04/29/2022 CLINICAL DATA:  Abdominal pain, acute nonlocalized. 88 year old with constipation and dysuria. EXAM: CT ABDOMEN AND PELVIS WITHOUT CONTRAST TECHNIQUE: Multidetector CT imaging of the abdomen and pelvis was performed following the standard protocol without IV contrast. RADIATION DOSE REDUCTION: This exam was performed according to the departmental dose-optimization program which includes automated exposure control, adjustment of the mA and/or kV according to patient size and/or use of iterative reconstruction technique. COMPARISON:  Abdominopelvic CT 09/06/2021 and 02/13/2017. FINDINGS: Lower chest: No acute findings. Mild scarring at both lung bases, similar to previous study. Hepatobiliary: The liver appears unremarkable as imaged in the noncontrast  state. No evidence of gallstones, gallbladder wall thickening or biliary dilatation. Pancreas: Unremarkable. No pancreatic ductal dilatation or surrounding inflammatory changes. Spleen: Normal in size without focal abnormality. Adrenals/Urinary Tract: Both adrenal glands appear normal. No evidence of urinary tract calculus, suspicious renal lesion or hydronephrosis. 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 The bladder appears unremarkable for its degree of distention. Stomach/Bowel: No enteric contrast administered. The stomach appears unremarkable for its degree of distension. No evidence of bowel wall thickening, distention or surrounding inflammatory change. Vascular/Lymphatic: Evidence of prior pelvic and retroperitoneal lymphadenectomy. No enlarged abdominopelvic lymph nodes are identified. Diffuse aortic and branch vessel atherosclerosis without evidence of aneurysm. Reproductive: Status post hysterectomy. No evidence of adnexal mass. Diffuse nodularity is noted throughout the subcutaneous fat of the perineum which appears unchanged, likely reflecting venous collaterals. Other: No evidence of abdominal wall mass or hernia. No ascites. Musculoskeletal: No acute or significant osseous findings. Chronic multilevel lumbar spondylosis. IMPRESSION: 1. No acute findings or explanation for the patient's symptoms. 2. No evidence of urinary tract calculus or hydronephrosis. 3. Stable postsurgical changes as described. 4.  Aortic Atherosclerosis (ICD10-I70.0). Electronically Signed   By: Richardean Sale M.D.   On: 04/29/2022 17:02    PROCEDURES:  Critical Care performed: No  Procedures   MEDICATIONS ORDERED IN ED: Medications - No data to display   IMPRESSION / MDM / [place] / ED COURSE  I reviewed the triage vital signs and the nursing notes.                              Differential diagnosis includes, but is not limited to, constipation,  diarrhea, colitis, diverticulitis, UTI, electrolyte abnormality  Patient's presentation is most consistent with acute presentation with potential threat to life or bodily function.   Patient's diagnosis is consistent with intermittent constipation.  Patient presents to the ED with periods of constipation followed by looser stools.  Patient is on chronic narcotic therapy for chronic pain, but does take Linzess and MiraLAX to combat her constipation.  Patient has off-and-on issues with constipation for a day or 2 followed by loose stools for a day or 2.  Overall exam was reassuring.  Labs are reassuring at this time.  Imaging reveals no evidence of colitis, diverticulitis, obstruction, stool impaction.  At this time as we are largely maintaining status quo with her current medications) I do not feel altering medications or adding medicines would largely benefit the patient.  Have discussed concerning signs and symptoms with the daughter.  Also recommended lifestyle changes to include regular meals, drinking fluid, continuing to move around to help with her GI issues.  Again return precautions discussed with the patient and daughter.  Follow-up primary care as needed..Patient is given ED precautions to return to the ED for any worsening or new symptoms.        FINAL CLINICAL IMPRESSION(S) / ED DIAGNOSES   Final diagnoses:  Intermittent constipation     Rx / DC Orders   ED Discharge Orders     None        Note:  This document was prepared using Dragon voice recognition software and may include unintentional dictation errors.   Brynda Peon 04/29/22 1803    Nena Polio, MD 04/29/22 Bosie Helper

## 2022-05-06 IMAGING — CT CT ABD-PELV W/ CM
2 of 5 series · 16 of 46 positions shown, 18 images · IV contrast (APPLIED)
Comparison: CT examination dated February 13, 2017

CLINICAL DATA: Abdominal pain, acute nonlocalized.

EXAM:
CT ABDOMEN AND PELVIS WITH CONTRAST
TECHNIQUE: Multidetector CT imaging of the abdomen and pelvis was performed
using the standard protocol following bolus administration of
intravenous contrast.

[Series 2: axial st · axial · 0.77mm/px · z∈[-772,-412]mm · 13 of 82 slices shown, 15 images]
[im 5/82  soft-tissue]
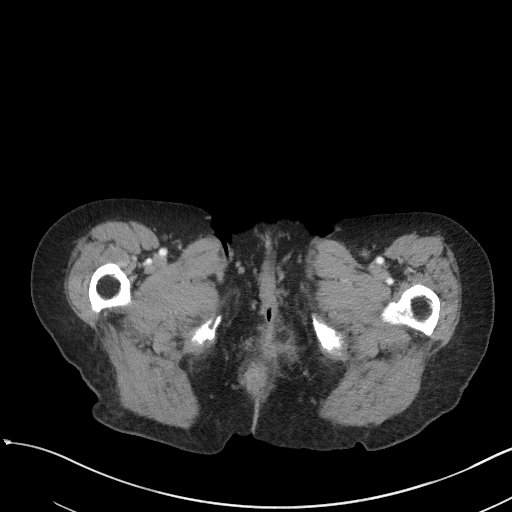
[im 5/82  bone]
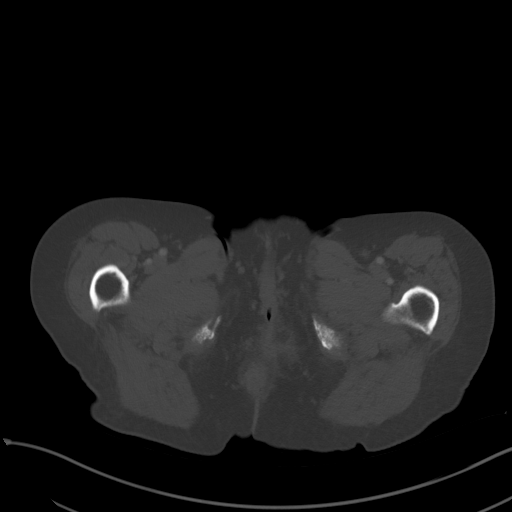
[im 10/82  soft-tissue]
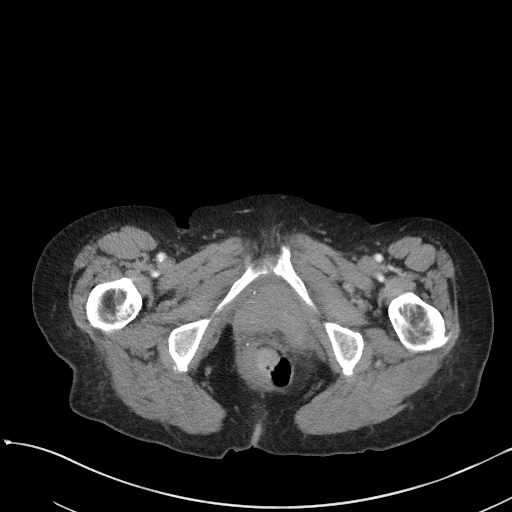
[im 20/82  soft-tissue]
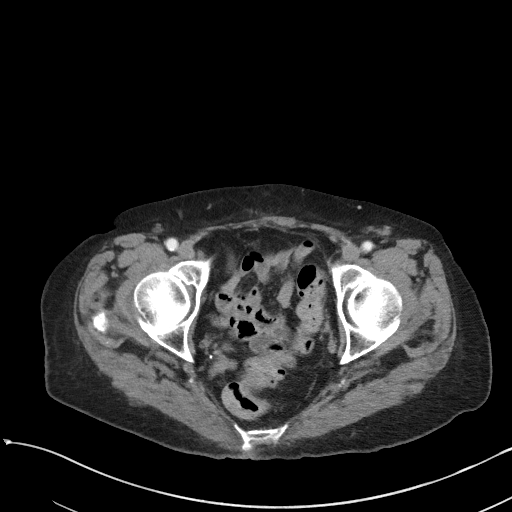
[im 24/82  soft-tissue]
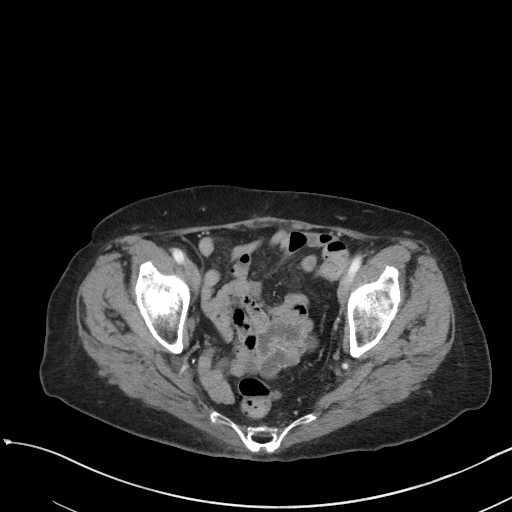
[im 29/82  soft-tissue]
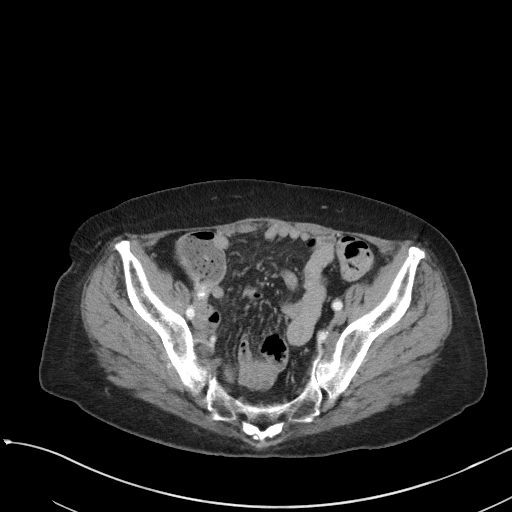
[im 34/82  soft-tissue]
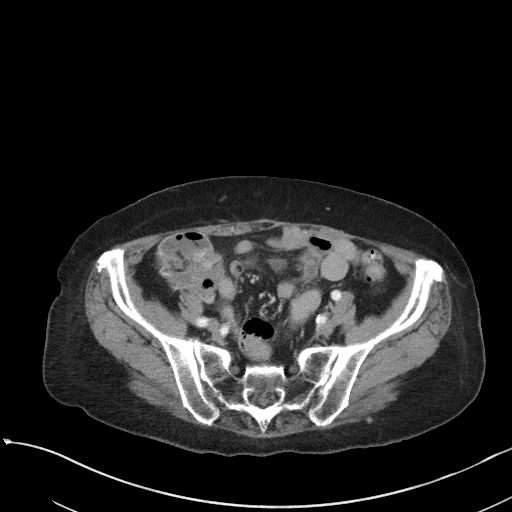
[im 43/82  soft-tissue]
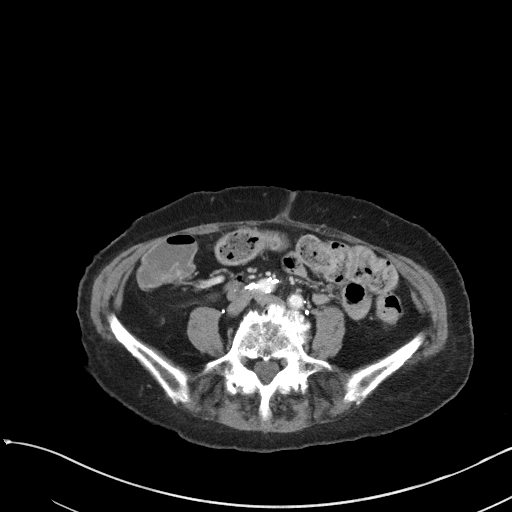
[im 48/82  soft-tissue]
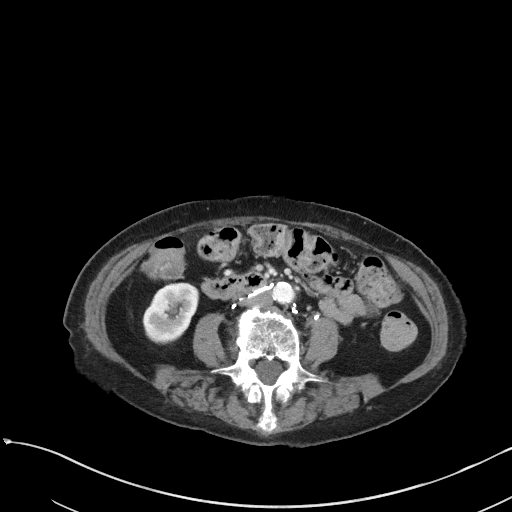
[im 53/82  soft-tissue]
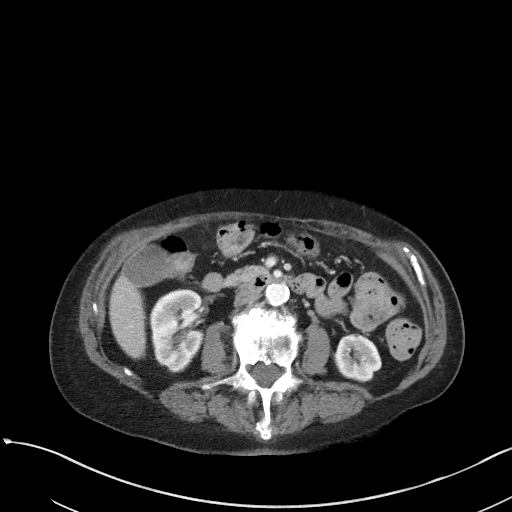
[im 53/82  bone]
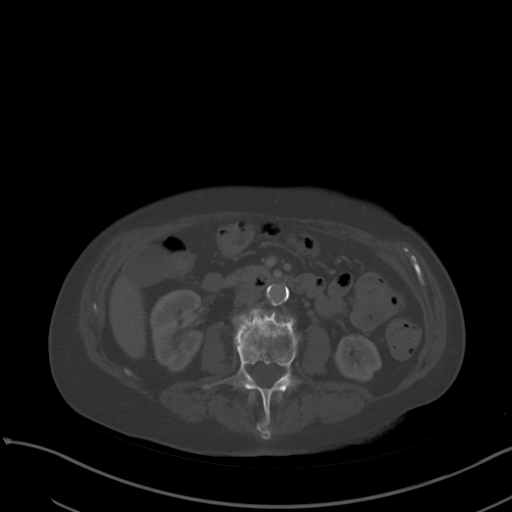
[im 58/82  soft-tissue]
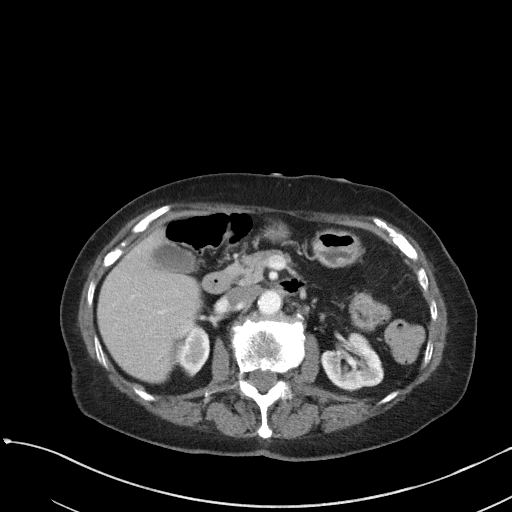
[im 62/82  soft-tissue]
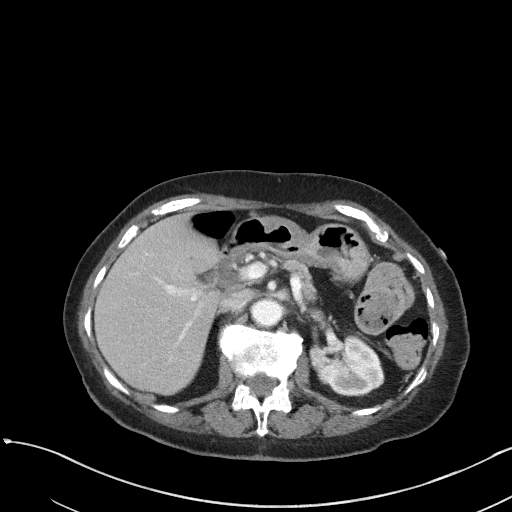
[im 72/82  soft-tissue]
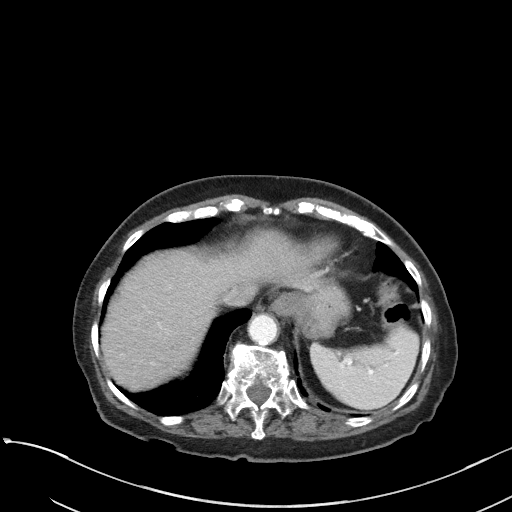
[im 77/82  soft-tissue]
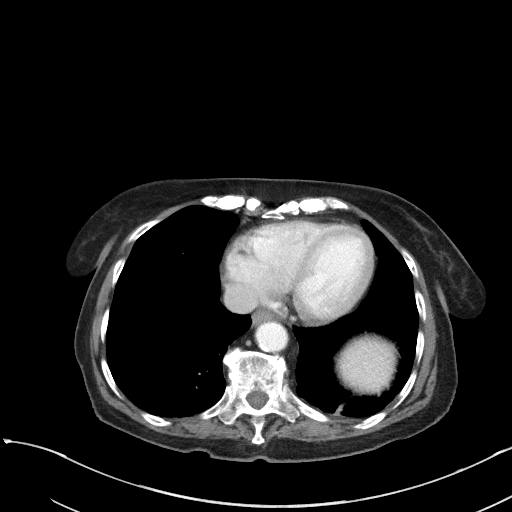

[Series 5: coronal st · coronal · 0.75mm/px · 3 of 74 slices shown]
[im 25/74  soft-tissue]
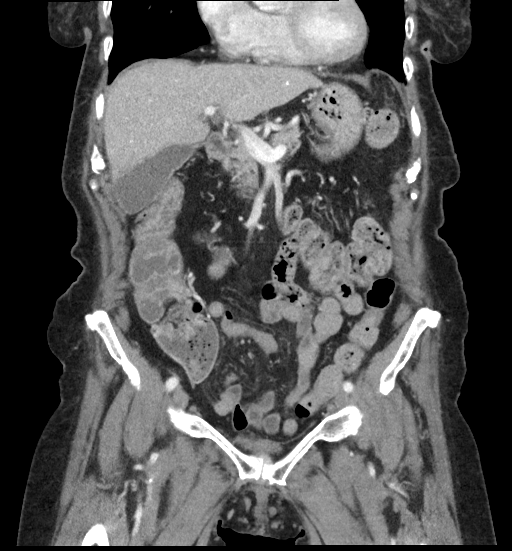
[im 33/74  soft-tissue]
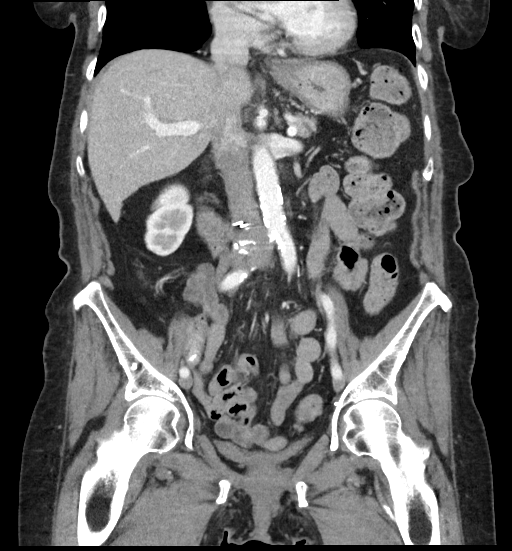
[im 41/74  soft-tissue]
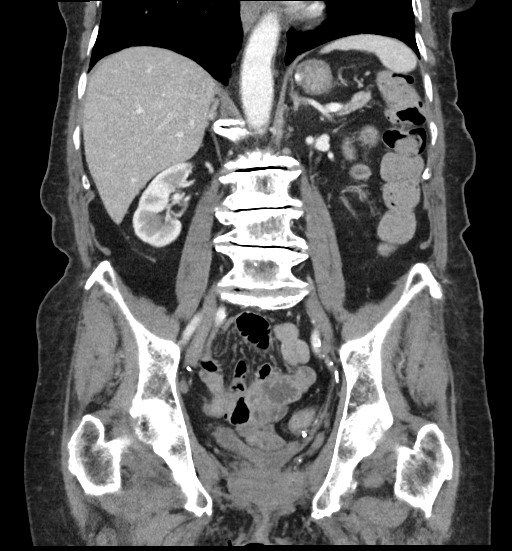

[16 of 46 positions shown; findings below may reference images not displayed]

RADIATION DOSE REDUCTION: This exam was performed according to the
departmental dose-optimization program which includes automated
exposure control, adjustment of the mA and/or kV according to
patient size and/or use of iterative reconstruction technique.

CONTRAST:  100mL OMNIPAQUE IOHEXOL 300 MG/ML  SOLN
FINDINGS: Lower chest: No acute abnormality.

Hepatobiliary: No focal liver abnormality is seen. No gallstones,
gallbladder wall thickening, or biliary dilatation.

Pancreas: Unremarkable. No pancreatic ductal dilatation or
surrounding inflammatory changes.

Spleen: Normal in size without focal abnormality.

Adrenals/Urinary Tract: Adrenal glands are unremarkable. Kidneys are
normal, without renal calculi, focal lesion, or hydronephrosis.
Small cyst in the lower pole of the right kidney, too small to
characterize. Bladder is unremarkable.

Stomach/Bowel: Stomach is within normal limits. Appendix not
visualized. No evidence of bowel wall thickening, distention, or
inflammatory changes. Retained colonic stool suggesting
constipation.

Vascular/Lymphatic: Aortic atherosclerosis. No enlarged abdominal or
pelvic lymph nodes.

Reproductive: Status post hysterectomy. No adnexal masses.

Other: No abdominal wall hernia or abnormality. No abdominopelvic
ascites.

Musculoskeletal: Advanced degenerate disc disease of the lower
lumbar spine with near complete loss of disc spaces and prominent
osteophytes. Associated facet joint arthropathy. No acute osseous
abnormality.
IMPRESSION: 1.  No CT evidence of acute abdominal/pelvic process.

2.  Retained colonic stool suggesting constipation.

3.  Atherosclerotic disease of abdominal aorta and branch vessels.

4. Advanced multilevel DA and disc disease of the lower lumbar
spine. No acute osseous abnormality.

## 2022-05-06 IMAGING — CR DG CHEST 2V
2 series · 2 of 2 positions shown · non-contrast
Comparison: 04/04/2016

CLINICAL DATA: Chest pain.  Abdominal discomfort.

EXAM:
CHEST - 2 VIEW

[chest pa]
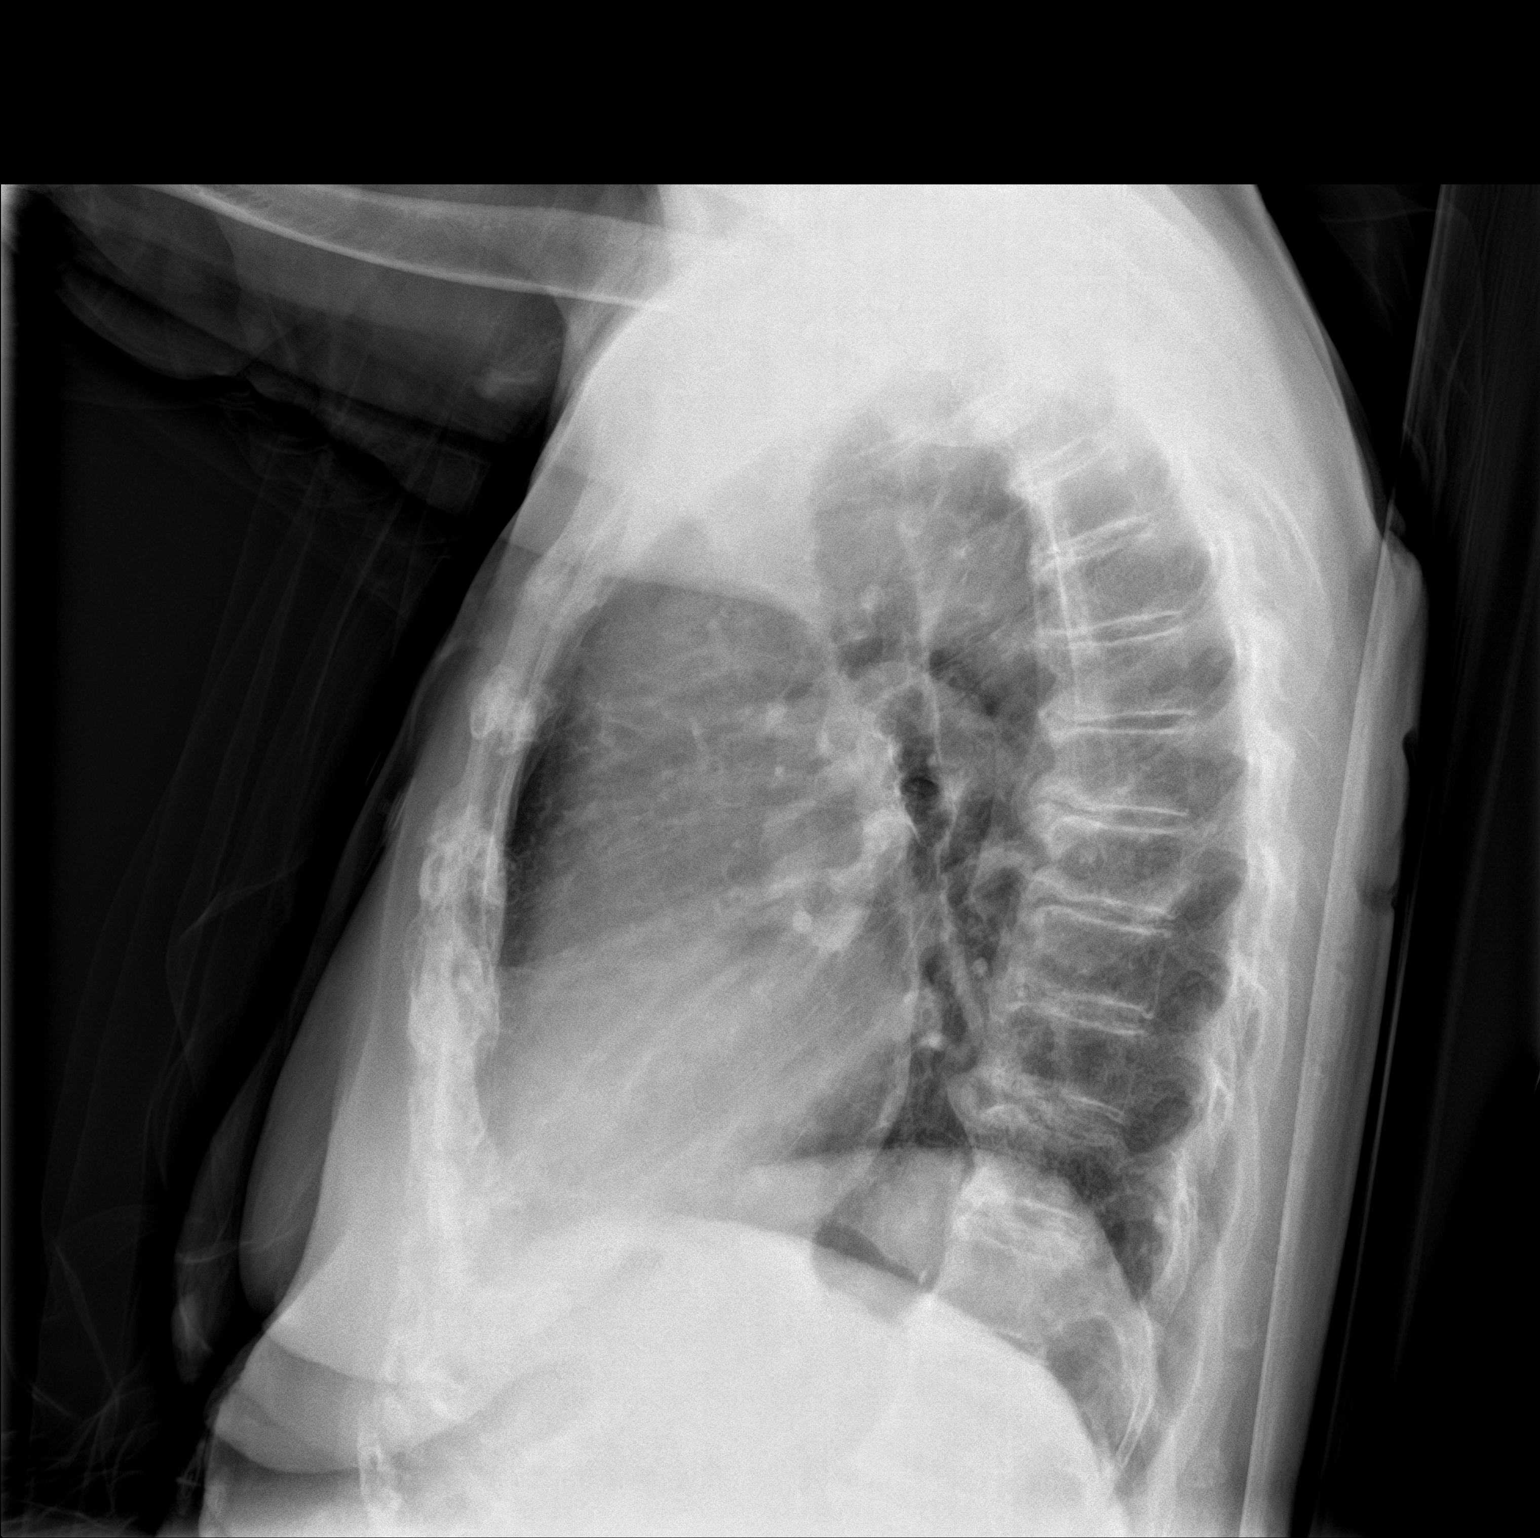

[chest lat]
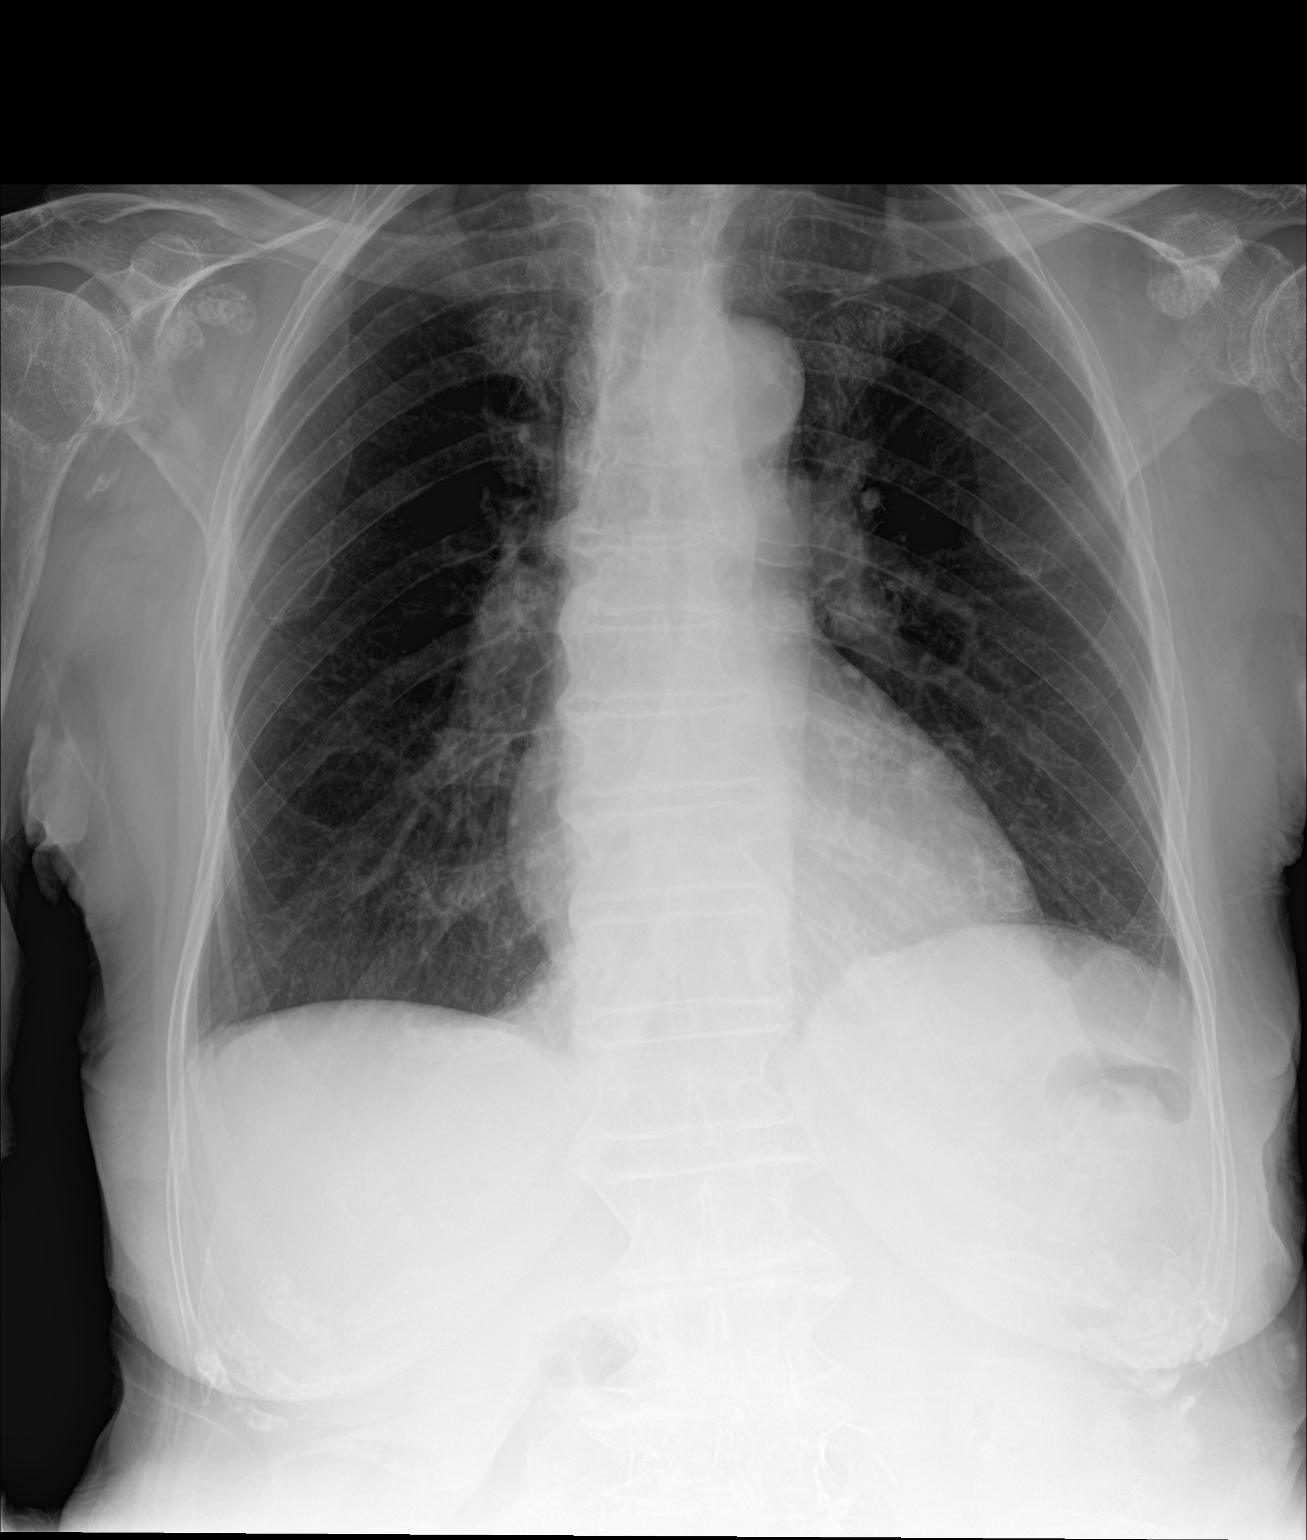

[2 of 2 positions shown; findings below may reference images not displayed]

FINDINGS: Midline trachea. Normal heart size. Tortuous thoracic aorta. No
pleural effusion or pneumothorax. Intra-articular loose bodies
within both glenohumeral joints.
IMPRESSION: No acute cardiopulmonary disease.

## 2022-08-03 ENCOUNTER — Encounter: Payer: Self-pay | Admitting: Otolaryngology

## 2024-04-15 ENCOUNTER — Other Ambulatory Visit: Payer: Self-pay | Admitting: Physical Medicine & Rehabilitation

## 2024-04-15 ENCOUNTER — Encounter: Payer: Self-pay | Admitting: Physical Medicine & Rehabilitation

## 2024-04-15 DIAGNOSIS — G8929 Other chronic pain: Secondary | ICD-10-CM

## 2024-05-01 ENCOUNTER — Ambulatory Visit
Admission: RE | Admit: 2024-05-01 | Discharge: 2024-05-01 | Disposition: A | Discharge status: Home / Self Care | Source: Ambulatory Visit | Attending: Physical Medicine & Rehabilitation | Admitting: Physical Medicine & Rehabilitation

## 2024-05-01 DIAGNOSIS — G8929 Other chronic pain: Secondary | ICD-10-CM
